# Patient Record
Sex: Female | Born: 1950 | Race: White | Hispanic: Refuse to answer | Marital: Married
Health system: Southern US, Community
[De-identification: ages and names within clinical notes are randomized; demographics above are authoritative.]

## PROBLEM LIST (undated history)

## (undated) DIAGNOSIS — G43909 Migraine, unspecified, not intractable, without status migrainosus: Secondary | ICD-10-CM

## (undated) DIAGNOSIS — I2510 Atherosclerotic heart disease of native coronary artery without angina pectoris: Secondary | ICD-10-CM

## (undated) DIAGNOSIS — E7800 Pure hypercholesterolemia, unspecified: Secondary | ICD-10-CM

## (undated) DIAGNOSIS — S060XAA Concussion with loss of consciousness status unknown, initial encounter: Secondary | ICD-10-CM

## (undated) DIAGNOSIS — S060X9A Concussion with loss of consciousness of unspecified duration, initial encounter: Secondary | ICD-10-CM

## (undated) DIAGNOSIS — M797 Fibromyalgia: Secondary | ICD-10-CM

## (undated) DIAGNOSIS — I10 Essential (primary) hypertension: Secondary | ICD-10-CM

## (undated) HISTORY — DX: Pure hypercholesterolemia, unspecified: E78.00

## (undated) HISTORY — DX: Concussion with loss of consciousness of unspecified duration, initial encounter: S06.0X9A

## (undated) HISTORY — DX: Atherosclerotic heart disease of native coronary artery without angina pectoris: I25.10

## (undated) HISTORY — DX: Concussion with loss of consciousness status unknown, initial encounter: S06.0XAA

## (undated) HISTORY — DX: Fibromyalgia: M79.7

## (undated) HISTORY — DX: Migraine, unspecified, not intractable, without status migrainosus: G43.909

## (undated) HISTORY — PX: CORONARY ANGIOPLASTY WITH STENT PLACEMENT: SHX49

---

## 2001-08-06 ENCOUNTER — Emergency Department: Admit: 2001-08-06 | Payer: Self-pay | Source: Emergency Department | Admitting: Emergency Medicine

## 2002-03-06 ENCOUNTER — Ambulatory Visit: Admit: 2002-03-06 | Disposition: A | Discharge status: Home / Self Care | Payer: Self-pay | Source: Ambulatory Visit

## 2003-05-01 ENCOUNTER — Ambulatory Visit
Admit: 2003-05-01 | Disposition: A | Discharge status: Home / Self Care | Payer: Self-pay | Source: Ambulatory Visit | Admitting: Internal Medicine

## 2004-07-25 ENCOUNTER — Ambulatory Visit: Admission: RE | Admit: 2004-07-25 | Payer: Self-pay | Source: Ambulatory Visit | Admitting: Gastroenterology

## 2004-07-31 ENCOUNTER — Ambulatory Visit
Admit: 2004-07-31 | Disposition: A | Discharge status: Home / Self Care | Payer: Self-pay | Source: Ambulatory Visit | Admitting: Internal Medicine

## 2004-08-21 ENCOUNTER — Ambulatory Visit
Admit: 2004-08-21 | Disposition: A | Discharge status: Home / Self Care | Payer: Self-pay | Source: Ambulatory Visit | Admitting: Orthopaedic Surgery

## 2004-10-29 ENCOUNTER — Ambulatory Visit
Admission: RE | Admit: 2004-10-29 | Disposition: A | Discharge status: Home / Self Care | Payer: Self-pay | Source: Ambulatory Visit | Admitting: Hand Surgery

## 2005-07-06 ENCOUNTER — Ambulatory Visit
Admit: 2005-07-06 | Disposition: A | Discharge status: Home / Self Care | Payer: Self-pay | Source: Ambulatory Visit | Admitting: Internal Medicine

## 2007-01-04 ENCOUNTER — Ambulatory Visit
Admit: 2007-01-04 | Disposition: A | Discharge status: Home / Self Care | Payer: Self-pay | Source: Ambulatory Visit | Admitting: Internal Medicine

## 2007-01-25 ENCOUNTER — Ambulatory Visit
Admission: RE | Admit: 2007-01-25 | Disposition: A | Discharge status: Home / Self Care | Payer: Self-pay | Source: Ambulatory Visit | Admitting: Cardiovascular Disease

## 2007-04-20 ENCOUNTER — Ambulatory Visit
Admit: 2007-04-20 | Disposition: A | Discharge status: Home / Self Care | Payer: Self-pay | Source: Ambulatory Visit | Admitting: Cardiovascular Disease

## 2007-04-24 ENCOUNTER — Emergency Department: Admit: 2007-04-24 | Payer: Self-pay | Source: Emergency Department | Admitting: Emergency Medicine

## 2007-04-24 LAB — CBC AND DIFFERENTIAL
Basophils Absolute: 0 /mm3 (ref 0.0–0.2)
Basophils: 0 % (ref 0–2)
Eosinophils Absolute: 0.1 /mm3 (ref 0.0–0.7)
Eosinophils: 2 % (ref 0–5)
Granulocytes Absolute: 3.6 /mm3 (ref 1.8–8.1)
Hematocrit: 37.7 % (ref 37.0–47.0)
Hgb: 12.5 G/DL (ref 12.0–16.0)
Immature Granulocytes Absolute: 0
Immature Granulocytes: 0 %
Lymphocytes Absolute: 2 /mm3 (ref 0.5–4.4)
Lymphocytes: 32 % (ref 15–41)
MCH: 29.8 PG (ref 28.0–32.0)
MCHC: 33.2 G/DL (ref 32.0–36.0)
MCV: 89.8 FL (ref 80.0–100.0)
MPV: 10.2 FL (ref 7.4–10.4)
Monocytes Absolute: 0.5 /mm3 (ref 0.0–1.2)
Monocytes: 8 % (ref 0–11)
Neutrophils %: 58 % (ref 52–75)
Platelets: 211 /mm3 (ref 140–400)
RBC: 4.2 /mm3 (ref 4.20–5.40)
RDW: 13.5 % (ref 11.5–15.0)
WBC: 6.28 /mm3 (ref 3.50–10.80)

## 2007-04-24 LAB — D-DIMER - SOFT: D-Dimer: 299 ng FEU

## 2007-04-24 LAB — BASIC METABOLIC PANEL
BUN: 9 mg/dL (ref 8–20)
CO2: 22 mEq/L (ref 21–30)
Calcium: 9 mg/dL (ref 8.6–10.2)
Chloride: 111 mEq/L — ABNORMAL HIGH (ref 98–107)
Creatinine: 1 mg/dL (ref 0.6–1.5)
Glucose: 93 mg/dL (ref 70–100)
Potassium: 4 mEq/L (ref 3.6–5.0)
Sodium: 144 mEq/L (ref 136–146)

## 2007-04-24 LAB — I-STAT TROPONIN CERNER: i-STAT Troponin: 0 ng/mL

## 2007-04-24 LAB — B-TYPE NATRIURETIC PEPTIDE: B-Natriuretic Peptide: 11 pg/mL (ref 0–155)

## 2007-04-24 LAB — CK: Creatine Kinase (CK): 40 U/L (ref 20–140)

## 2007-04-24 LAB — GFR

## 2007-05-02 ENCOUNTER — Ambulatory Visit
Admit: 2007-05-02 | Disposition: A | Discharge status: Home / Self Care | Payer: Self-pay | Source: Ambulatory Visit | Admitting: Neurology

## 2007-07-22 ENCOUNTER — Ambulatory Visit
Admit: 2007-07-22 | Disposition: A | Discharge status: Home / Self Care | Payer: Self-pay | Source: Ambulatory Visit | Admitting: Cardiovascular Disease

## 2007-09-29 ENCOUNTER — Ambulatory Visit
Admission: RE | Admit: 2007-09-29 | Disposition: A | Discharge status: Home / Self Care | Payer: Self-pay | Source: Ambulatory Visit | Admitting: Cardiology

## 2008-01-08 ENCOUNTER — Observation Stay
Admission: EM | Admit: 2008-01-08 | Disposition: A | Discharge status: Home / Self Care | Payer: Self-pay | Source: Emergency Department | Admitting: Cardiovascular Disease

## 2008-01-08 LAB — COMPREHENSIVE METABOLIC PANEL
ALT: 31 U/L (ref 3–36)
AST (SGOT): 25 U/L (ref 10–41)
Albumin/Globulin Ratio: 1.3 (ref 1.1–1.8)
Albumin: 3.8 g/dL (ref 3.4–4.9)
Alkaline Phosphatase: 152 U/L — ABNORMAL HIGH (ref 43–112)
BUN: 15 mg/dL (ref 8–20)
Bilirubin, Total: <0.1 mg/dL (ref 0.1–1.0)
CO2: 21 mEq/L (ref 21–30)
Calcium: 9.4 mg/dL (ref 8.6–10.2)
Chloride: 110 mEq/L — ABNORMAL HIGH (ref 98–107)
Creatinine: 1.1 mg/dL (ref 0.6–1.5)
Globulin: 3 g/dL (ref 2.0–3.7)
Glucose: 86 mg/dL (ref 70–100)
Potassium: 4 mEq/L (ref 3.6–5.0)
Protein, Total: 6.8 g/dL (ref 6.0–8.0)
Sodium: 143 mEq/L (ref 136–146)

## 2008-01-08 LAB — CBC AND DIFFERENTIAL
Basophils Absolute: 0 /mm3 (ref 0.0–0.2)
Basophils: 0 % (ref 0–2)
Eosinophils Absolute: 0.3 /mm3 (ref 0.0–0.7)
Eosinophils: 4 % (ref 0–5)
Granulocytes Absolute: 4.2 /mm3 (ref 1.8–8.1)
Hematocrit: 36.6 % — ABNORMAL LOW (ref 37.0–47.0)
Hgb: 12.3 G/DL (ref 12.0–16.0)
Immature Granulocytes Absolute: 0
Immature Granulocytes: 0 %
Lymphocytes Absolute: 2 /mm3 (ref 0.5–4.4)
Lymphocytes: 29 % (ref 15–41)
MCH: 30.4 PG (ref 28.0–32.0)
MCHC: 33.6 G/DL (ref 32.0–36.0)
MCV: 90.4 FL (ref 80.0–100.0)
MPV: 9.7 FL (ref 9.4–12.3)
Monocytes Absolute: 0.5 /mm3 (ref 0.0–1.2)
Monocytes: 7 % (ref 0–11)
Neutrophils %: 60 % (ref 52–75)
Platelets: 202 /mm3 (ref 140–400)
RBC: 4.05 /mm3 — ABNORMAL LOW (ref 4.20–5.40)
RDW: 12.3 % (ref 11.5–15.0)
WBC: 7.07 /mm3 (ref 3.50–10.80)

## 2008-01-08 LAB — GFR

## 2008-01-08 LAB — CK
Creatine Kinase (CK): 35 U/L (ref 20–140)
Creatine Kinase (CK): 32 U/L (ref 20–140)

## 2008-01-08 LAB — I-STAT TROPONIN CERNER: i-STAT Troponin: 0.01 ng/mL

## 2008-01-09 LAB — TROPONIN I QUANTITATIVE LEVEL CERNER
Troponin I: 0.02 ng/mL
Troponin I: 0.01 ng/mL

## 2008-01-09 LAB — CK: Creatine Kinase (CK): 23 U/L (ref 20–140)

## 2008-01-17 ENCOUNTER — Ambulatory Visit
Admit: 2008-01-17 | Disposition: A | Discharge status: Home / Self Care | Payer: Self-pay | Source: Ambulatory Visit | Admitting: Neurology

## 2008-02-12 ENCOUNTER — Emergency Department: Admit: 2008-02-12 | Payer: Self-pay | Source: Emergency Department | Admitting: Emergency Medicine

## 2008-07-02 ENCOUNTER — Emergency Department: Admit: 2008-07-02 | Payer: Self-pay | Source: Emergency Department | Admitting: Emergency Medicine

## 2008-07-02 LAB — PT AND APTT
PT INR: 1.1 {INR} (ref 0.9–1.1)
PT: 12.5 s (ref 10.8–13.3)
PTT: 22 s (ref 21–32)

## 2008-07-02 LAB — COMPREHENSIVE METABOLIC PANEL
ALT: 37 U/L — ABNORMAL HIGH (ref 3–36)
AST (SGOT): 28 U/L (ref 10–41)
Albumin/Globulin Ratio: 1.4 (ref 1.1–1.8)
Albumin: 4.2 g/dL (ref 3.4–4.9)
Alkaline Phosphatase: 120 U/L — ABNORMAL HIGH (ref 43–112)
BUN: 15 mg/dL (ref 8–20)
Bilirubin, Total: <0.1 mg/dL (ref 0.1–1.0)
CO2: 26 mEq/L (ref 21–30)
Calcium: 9.7 mg/dL (ref 8.6–10.2)
Chloride: 108 mEq/L — ABNORMAL HIGH (ref 98–107)
Creatinine: 1.1 mg/dL (ref 0.6–1.5)
Globulin: 3.1 g/dL (ref 2.0–3.7)
Glucose: 81 mg/dL (ref 70–100)
Potassium: 4 mEq/L (ref 3.6–5.0)
Protein, Total: 7.3 g/dL (ref 6.0–8.0)
Sodium: 140 mEq/L (ref 136–146)

## 2008-07-02 LAB — CBC AND DIFFERENTIAL
Basophils Absolute: 0 /mm3 (ref 0.0–0.2)
Basophils: 0 % (ref 0–2)
Eosinophils Absolute: 0.3 /mm3 (ref 0.0–0.7)
Eosinophils: 4 % (ref 0–5)
Granulocytes Absolute: 4 /mm3 (ref 1.8–8.1)
Hematocrit: 36.8 % — ABNORMAL LOW (ref 37.0–47.0)
Hgb: 12.4 G/DL (ref 12.0–16.0)
Immature Granulocytes Absolute: 0
Immature Granulocytes: 1 %
Lymphocytes Absolute: 2.1 /mm3 (ref 0.5–4.4)
Lymphocytes: 30 % (ref 15–41)
MCH: 30.1 PG (ref 28.0–32.0)
MCHC: 33.7 G/DL (ref 32.0–36.0)
MCV: 89.3 FL (ref 80.0–100.0)
MPV: 9.8 FL (ref 9.4–12.3)
Monocytes Absolute: 0.5 /mm3 (ref 0.0–1.2)
Monocytes: 8 % (ref 0–11)
Neutrophils %: 58 % (ref 52–75)
Platelets: 213 /mm3 (ref 140–400)
RBC: 4.12 /mm3 — ABNORMAL LOW (ref 4.20–5.40)
RDW: 13.2 % (ref 11.5–15.0)
WBC: 6.85 /mm3 (ref 3.50–10.80)

## 2008-07-02 LAB — TSH: TSH: 4.198 u[IU]/mL (ref 0.350–4.940)

## 2008-07-02 LAB — URINALYSIS WITH MICROSCOPIC
Bilirubin, UA: NEGATIVE
Blood, UA: NEGATIVE
Glucose, UA: NEGATIVE
Ketones UA: NEGATIVE
Leukocyte Esterase, UA: NEGATIVE
Nitrite, UA: NEGATIVE
Protein, UR: NEGATIVE
RBC, UA: 3 /HPF (ref 0–3)
Specific Gravity UA POCT: 1.014 (ref 1.001–1.035)
Squamous Epithelial Cells, Urine: 1 /HPF
Urine pH: 7 (ref 5.0–8.0)
Urobilinogen, UA: NORMAL mg/dL

## 2008-07-02 LAB — CK: Creatine Kinase (CK): 41 U/L (ref 20–140)

## 2008-07-02 LAB — T4: T4: 5.77 ug/dL (ref 4.87–11.72)

## 2008-07-02 LAB — I-STAT TROPONIN CERNER
i-STAT Troponin: 0 ng/mL
i-STAT Troponin: 0 ng/mL

## 2008-07-02 LAB — GFR

## 2009-01-07 ENCOUNTER — Ambulatory Visit
Admit: 2009-01-07 | Disposition: A | Discharge status: Home / Self Care | Payer: Self-pay | Source: Ambulatory Visit | Admitting: Neurology

## 2009-03-08 ENCOUNTER — Ambulatory Visit
Admit: 2009-03-08 | Disposition: A | Discharge status: Home / Self Care | Payer: Self-pay | Source: Ambulatory Visit | Admitting: Rheumatology

## 2010-02-26 ENCOUNTER — Observation Stay
Admission: EM | Admit: 2010-02-26 | Disposition: A | Discharge status: Home / Self Care | Payer: Self-pay | Source: Emergency Department | Admitting: Internal Medicine

## 2010-02-26 LAB — CBC AND DIFFERENTIAL
Baso(Absolute): 0.02 10*3/uL (ref 0.00–0.20)
Basophils: 0 % (ref 0–2)
Eosinophils Absolute: 0.3 10*3/uL (ref 0.00–0.70)
Eosinophils: 3 % (ref 0–5)
Hematocrit: 37.1 % (ref 37.0–47.0)
Hgb: 12.2 g/dL (ref 12.0–16.0)
Immature Granulocytes Absolute: 0.02 10*3/uL
Immature Granulocytes: 0 % (ref 0–1)
Lymphocytes Absolute: 2.03 10*3/uL (ref 0.50–4.40)
Lymphocytes: 21 % (ref 15–41)
MCH: 29.3 pg (ref 28.0–32.0)
MCHC: 32.9 g/dL (ref 32.0–36.0)
MCV: 89 fL (ref 80.0–100.0)
MPV: 9.8 fL (ref 9.4–12.3)
Monocytes Absolute: 0.59 10*3/uL (ref 0.00–1.20)
Monocytes: 6 % (ref 0–11)
Neutrophils Absolute: 6.61 10*3/uL
Neutrophils: 69 % (ref 52–75)
Platelets: 192 10*3/uL (ref 140–400)
RBC: 4.17 10*6/uL — ABNORMAL LOW (ref 4.20–5.40)
RDW: 13 % (ref 12–15)
WBC: 9.57 10*3/uL (ref 3.50–10.80)

## 2010-02-26 LAB — GFR: EGFR: 57

## 2010-02-26 LAB — BASIC METABOLIC PANEL
BUN: 12 mg/dL (ref 8–20)
CO2: 21 mEq/L (ref 21–30)
Calcium: 8.9 mg/dL (ref 8.6–10.2)
Chloride: 112 mEq/L — ABNORMAL HIGH (ref 98–107)
Creatinine: 1 mg/dL (ref 0.6–1.5)
Glucose: 90 mg/dL (ref 70–100)
Potassium: 4 mEq/L (ref 3.6–5.0)
Sodium: 142 mEq/L (ref 136–146)

## 2010-02-26 LAB — D-DIMER - SOFT: D-Dimer: 358 ng/mL FEU (ref 0–499)

## 2010-02-26 LAB — TROPONIN I: Troponin I: 0 ng/mL (ref 0.00–0.09)

## 2010-02-26 LAB — CK: Creatine Kinase (CK): 32 U/L (ref 20–140)

## 2010-02-26 LAB — I-STAT TROPONIN: i-STAT Troponin: 0 ng/mL (ref 0.00–0.09)

## 2010-02-27 LAB — CBC
Hematocrit: 34.5 % — ABNORMAL LOW (ref 37.0–47.0)
Hgb: 11.2 g/dL — ABNORMAL LOW (ref 12.0–16.0)
MCH: 28.9 pg (ref 28.0–32.0)
MCHC: 32.5 g/dL (ref 32.0–36.0)
MCV: 89.1 fL (ref 80.0–100.0)
MPV: 10.1 fL (ref 9.4–12.3)
Platelets: 184 10*3/uL (ref 140–400)
RBC: 3.87 10*6/uL — ABNORMAL LOW (ref 4.20–5.40)
RDW: 14 % (ref 12–15)
WBC: 7.69 10*3/uL (ref 3.50–10.80)

## 2010-02-27 LAB — GFR: EGFR: 57

## 2010-02-27 LAB — LIPID PANEL
Cholesterol / HDL Ratio: 3.1 Index
Cholesterol: 149 mg/dL (ref 0–199)
HDL: 48 mg/dL (ref >40)
LDL Calculated: 81 mg/dL (ref 0–99)
Triglycerides: 102 mg/dL (ref 34–149)
VLDL Calculated: 20 mg/dL (ref 10–40)

## 2010-02-27 LAB — COMPREHENSIVE METABOLIC PANEL
ALT: 62 U/L — ABNORMAL HIGH (ref 3–36)
AST (SGOT): 41 U/L (ref 10–41)
Albumin/Globulin Ratio: 1.3 (ref 1.1–1.8)
Albumin: 3.5 g/dL (ref 3.4–4.9)
Alkaline Phosphatase: 133 U/L — ABNORMAL HIGH (ref 43–112)
BUN: 11 mg/dL (ref 8–20)
Bilirubin, Total: 0.6 mg/dL (ref 0.1–1.0)
CO2: 21 mEq/L (ref 21–30)
Calcium: 8.7 mg/dL (ref 8.6–10.2)
Chloride: 110 mEq/L — ABNORMAL HIGH (ref 98–107)
Creatinine: 1 mg/dL (ref 0.6–1.5)
Globulin: 2.7 g/dL (ref 2.0–3.7)
Glucose: 89 mg/dL (ref 70–100)
Potassium: 3.7 mEq/L (ref 3.6–5.0)
Protein, Total: 6.2 g/dL (ref 6.0–8.0)
Sodium: 140 mEq/L (ref 136–146)

## 2010-02-27 LAB — HEMOLYSIS INDEX: Hemolysis Index: 14 Index — ABNORMAL HIGH (ref 0–9)

## 2010-02-27 LAB — TROPONIN I: Troponin I: 0 ng/mL (ref 0.00–0.09)

## 2010-02-27 LAB — CK: Creatine Kinase (CK): 39 U/L (ref 20–140)

## 2011-02-18 LAB — ECG 12-LEAD
Atrial Rate: 66 {beats}/min
Atrial Rate: 66 {beats}/min
Atrial Rate: 82 {beats}/min
P Axis: 20 degrees
P Axis: 24 degrees
P Axis: 6 degrees
P-R Interval: 150 ms
P-R Interval: 170 ms
P-R Interval: 176 ms
Q-T Interval: 346 ms
Q-T Interval: 380 ms
Q-T Interval: 410 ms
QRS Duration: 76 ms
QRS Duration: 82 ms
QRS Duration: 86 ms
QTC Calculation (Bezet): 398 ms
QTC Calculation (Bezet): 404 ms
QTC Calculation (Bezet): 429 ms
R Axis: -3 degrees
R Axis: -3 degrees
R Axis: -8 degrees
T Axis: 18 degrees
T Axis: 32 degrees
T Axis: 8 degrees
Ventricular Rate: 66 {beats}/min
Ventricular Rate: 66 {beats}/min
Ventricular Rate: 82 {beats}/min

## 2011-02-20 LAB — ECG 12-LEAD
Atrial Rate: 76 {beats}/min
P Axis: 13 degrees
P-R Interval: 142 ms
Q-T Interval: 366 ms
QRS Duration: 76 ms
QTC Calculation (Bezet): 411 ms
R Axis: 2 degrees
T Axis: 19 degrees
Ventricular Rate: 76 {beats}/min

## 2011-03-03 LAB — ECG 12-LEAD
Atrial Rate: 52 {beats}/min
Atrial Rate: 60 {beats}/min
P Axis: -1 degrees
P Axis: -5 degrees
P-R Interval: 144 ms
P-R Interval: 148 ms
Q-T Interval: 414 ms
Q-T Interval: 454 ms
QRS Duration: 80 ms
QRS Duration: 86 ms
QTC Calculation (Bezet): 414 ms
QTC Calculation (Bezet): 422 ms
R Axis: -3 degrees
R Axis: -4 degrees
T Axis: 17 degrees
T Axis: 25 degrees
Ventricular Rate: 52 {beats}/min
Ventricular Rate: 60 {beats}/min

## 2011-03-10 LAB — ECG 12-LEAD
Atrial Rate: 52 {beats}/min
Atrial Rate: 62 {beats}/min
P Axis: 12 degrees
P Axis: 4 degrees
P-R Interval: 138 ms
P-R Interval: 168 ms
Q-T Interval: 414 ms
Q-T Interval: 448 ms
QRS Duration: 74 ms
QRS Duration: 82 ms
QTC Calculation (Bezet): 416 ms
QTC Calculation (Bezet): 420 ms
R Axis: 15 degrees
R Axis: 7 degrees
T Axis: 48 degrees
T Axis: 49 degrees
Ventricular Rate: 52 {beats}/min
Ventricular Rate: 62 {beats}/min

## 2011-03-12 LAB — ECG 12-LEAD
Atrial Rate: 62 {beats}/min
P Axis: 20 degrees
P-R Interval: 156 ms
Q-T Interval: 406 ms
QRS Duration: 82 ms
QTC Calculation (Bezet): 412 ms
R Axis: 12 degrees
T Axis: 41 degrees
Ventricular Rate: 62 {beats}/min

## 2011-03-20 NOTE — H&P (Signed)
[name], [name]      MRN:          [id number]      Account:      [id number]      Document ID:  [id number]                  Admit Date: 02/26/2010            Patient Location: [facility]      Patient Type: I            ATTENDING PHYSICIAN: Herbert Moors, MD                  CHIEF COMPLAINT:      Chest pain, patient admitted to rule out MI.            HISTORY OF PRESENT ILLNESS:      This is a 60 year old female with multiple medical history significant for      hypertension, elevated cholesterol, fibromyalgia, depression, coronary      artery disease status post stent, history of right shoulder surgery, and      obesity, who presented to the emergency room with the complaints of chest      pain.  As per history, the patient was apparently doing okay.  She got up      with chest pain, severe in nature, could not tolerate.  Pain was mainly      over the anterior part of the chest, left precordial area, left      retrosternal area, and radiating towards the shoulder.  She also complains      of pain over the left upper arm and also left hand pricking sensation.  She      denied any shortness of breath but states that she had shallow breathing to      avoid pain.  She denied any fever.  No cough, no headache in the beginning      but started after using nitroglycerin.  She said her left eye is always      like that.  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  No diaphoresis, no swelling      legs, no joint swelling, no abdominal pain, no altered bowel or urinary      habits.            PAST MEDICAL HISTORY:      Hypertension, elevated cholesterol, fibromyalgia, depression, obesity,      right shoulder surgery, coronary artery disease status post stent,      glaucoma, left eye.  Negative for diabetes mellitus.            PERSONAL HISTORY:      Negative for smoking, negative for drugs, and negative for alcohol.            FAMILY HISTORY:      Noncontributory.            MEDICATIONS:      Include Lovaza, Allegra, vitamin B12,  Colace, Caltrate, Zetia, magnesium,      Zoloft, Lipitor, aspirin.            ALLERGIES:      No known drug allergies.                                   Page 1 of 3      DEL, WISEMAN      MRN:  [id number]      Account:      [id number]      Document ID:  [id number]                        PHYSICAL EXAMINATION:      GENERAL:  The patient is conscious, alert, oriented.      VITAL SIGNS:  Temperature 99.4, pulse 88 per minute, blood pressure 122/66      mmHg, respiratory rate 16 per minute, and pulse oximetry 93%.      HEAD AND NECK:  Drooping of upper lid left eye present.      CARDIOVASCULAR:  S1, S2 heard with normal intensity in all 4 areas.  No      murmur.      RESPIRATORY:  Lungs clear per auscultation.      ABDOMEN:  Soft.      EXTREMITIES:  No edema.      CENTRAL NERVOUS SYSTEM:  No changes.            LABORATORY DATA:      WBC 9.5, hemoglobin 12, hematocrit 37, MCV 89, platelets 192,000.  Sodium      142, potassium 4, chloride 112, carbon dioxide 21, BUN 12, creatinine 1,      glucose 90, calcium 8.9.  CK 32.  Troponin 0.00 x2.  D-dimer 358.            RADIOLOGIC DATA:      Chest x-ray shows left mild basilar atelectasis.            DIAGNOSTIC DATA:      EKG:  Normal sinus rhythm with T-wave changes in the anterior leads.            IMPRESSION:      1.  Chest pain, rule out myocardial infarction.      2.  Abnormal electrocardiogram, rule out myocardial infarction.      3.  Hypertension.      4.  Elevated cholesterol.      5.  History of coronary artery disease status post stent.      6.  Fibromyalgia.      7.  Depression.      8.  Obesity.      9.  Glaucoma, left eye.            PLAN:      Admit the patient to telemetry and monitor vital signs.  Rule out      myocardial infarction with enzymes.  Get a cardiology consult.  Continue      with her medication.  Lipid profile in the morning, Echocardiogram.  The      patient may go for a stress test.  Activity as tolerated.  Discharge to      home when  stable.                        Electronic Signing Provider                                   Page 2 of 3      SHERAH, LUND      MRN:          [id number]      Account:      [id number]      Document ID:  [id number]  D:  02/26/2010 21:22 PM by Dr. Herbert Moors, MD [phone]      T:  02/26/2010 21:56 PM by [id number]                  cc:                                   Page 3 of 3      Authenticated by Herbert Moors ([id number]) On 03/04/2010 07:58:07 PM

## 2011-03-20 NOTE — Consults (Signed)
[name], [name]      MRN:          [id number]      Account:      [id number]      Document ID:  [id number]      Service Date: 02/26/2010            Admit Date: 02/26/2010            Patient Location: [facility]01      Patient Type: I            CONSULTING PHYSICIAN: Sharaine Delange Larina Earthly MD            REFERRING PHYSICIAN: Karma Ganja MD            REASON FOR CONSULTATION:      Evaluation of chest pain.            HISTORY OF PRESENT ILLNESS:      The patient is a 60 year old female with a known history of coronary      disease.  She is status post PCI of the D2 branch because of chest pain in      2009.  She presents here because of chest pain that occurred this morning.      She states she woke up and had chest pain that was with cough, inspiration,      and sitting up.  She states that her activities have been slightly      diminished because of this.  She has some mild shortness of breath because      of it as well.  She states she has had bronchitis about 4 or 5 months ago      and has had on and off sinus infection since.  She denies any      lightheadedness, dizziness, and no syncope, but she is weak overall today      compared to before.  Her chest pain is retrosternal, nonradiating, and 5/10      in severity, and there are no provocative or palliative features.            PAST MEDICAL HISTORY:      1.  CAD as noted above with history of LAD PCI and angioplasty of D2 in      2009 and history of PCI of the LAD and D1 in 2008.      2.  Fibromyalgia.      3.  Migraines.      4.  Hyperlipidemia.            ALLERGIES:      PLAVIX.            MEDICATIONS:      At home, aspirin, metoprolol, pravastatin, Topamax, Zetia.            SOCIAL HISTORY:      Noncontributory.            FAMILY HISTORY:      Noncontributory.                                         Page 1 of 3      [name], [name]      MRN:          [id number]      Account:      [id number]      Document ID:  [id number]  Service Date: 02/26/2010             PHYSICAL EXAMINATION:      VITAL SIGNS:  Blood pressure is 126/80.  Pulse is 70.      NECK:  Supple, nontender.      CARDIOVASCULAR:  Rate and rhythm regular.  Normal S1, normal S2, no S3.      LUNGS:  Clear to auscultation bilaterally.      ABDOMEN:  Soft, nontender.      EXTREMITIES:  2+ pedal pulses.  No edema.            DIAGNOSTIC DATA:      EKG from the emergency room shows sinus rhythm, nonspecific ST-T wave      changes throughout the precordium, no evidence of pericarditis on this ECG.       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  I do      not have any old EKGs to compare to.            LABORATORY DATA:      WBC 9, hemoglobin 12, hematocrit 37, platelet count 182.  Sodium 142,      potassium 4.0, BUN is 12, creatinine 1.0.  Troponin 0.00.  D-dimer is 358.            IMPRESSION:      1.  Atypical chest pain, coronary artery disease.      2.  Possible pleuritis or pericarditis as a potential cause.      3.  History of fibromyalgia.      4.  History of Plavix allergy.      5.  History of coronary artery disease.      6.  History of percutaneous coronary intervention to the left anterior      descending and angioplasty to the D1.            RECOMMENDATIONS:      The patient has atypical chest pain, coronary artery disease.  She will be      fully ruled out here.  She will restart all of her medicines from home.      She does not have the list with her, but her husband is bringing in the      list from home as well.  We will check an echocardiogram to assess for      myocarditis or signs for any potential wall motion abnormalities or      pericardial effusion as a potential contributing cause of her symptoms, but      I think it is highly unlikely and seems like her symptoms are not related      to cardiovascular causes as of now.  If she is ruled out, she can have an      outpatient stress test and follow up with Dr. Pecolia Ades.            Thank you for allowing me to participate  with the patient.  We will follow      with you.                        Electronic Signing Provider                                         Page 2 of 3  [name], [name]      MRN:          [id number]      Account:      [id number]      Document ID:  [id number]      Service Date: 02/26/2010            D:  02/26/2010 15:58 PM by Dr. Laurence Compton. [place] ([id number])      T:  02/26/2010 17:30 PM by [id number]                  cc:                                   Page 3 of 3      Authenticated and Edited by Baldwin Crown, MD On 02/28/10 12:53:30 PM

## 2011-03-20 NOTE — Op Note (Signed)
DATE OF BIRTH:                        [birth year]      ADMISSION DATE:                     09/29/2007            PATIENT LOCATION:                     [facility]            DATE OF PROCEDURE:                   09/29/2007      SURGEON:                            Nelida Gores, MD      ASSISTANT(S):                  PREOPERATIVE DIAGNOSIS:  ATHEROSCLEROTIC HEART DISEASE, CHEST PAIN, STATUS      POST STENTING OF THE LEFT ANTERIOR DESCENDING CORONARY ARTERY.            POSTOPERATIVE DIAGNOSIS:  ATHEROSCLEROTIC HEART DISEASE WITH WIDELY PATENT      STENTED SEGMENT OF THE LEFT ANTERIOR DESCENDING CORONARY ARTERY AND      ANGIOPLASTIED SEGMENT OF THE SECOND DIAGONAL BRANCH.            PROCEDURE:  LEFT HEART CATHETERIZATION, SELECTIVE CORONARY ANGIOGRAM, LEFT      VENTRICULOGRAM AND INTRAVASCULAR ULTRASOUND OF THE LEFT ANTERIOR DESCENDING      CORONARY ARTERY.            PATIENT DATA:  Age;  60.  Sex;  female.  Height;  178 cm.  Weight;  109 kg.      Body surface area;  2.3 m2.  Rhythm;  sinus.            DESCRIPTION OF PROCEDURE:  The patient was medicated with Versed 1 mg IV      and fentanyl a total of 75 mcg IV.  Using modified Seldinger technique      employing a sheath, a 4.5 left Judkins catheter was inserted in the right      femoral sheath and advanced to the left coronary ostium.  Selective left      coronary angiogram was obtained in multiple projections.  The catheter was      exchanged for a 4-French 3DRC catheter, which was used to obtain selective      right coronary angiograms.  The catheter was exchanged for a 4-French      pigtail catheter, which was positioned in the left ventricle and pressure      recorded.  In a 35 degree RAO projection, left ventriculogram was obtained,      injecting Visipaque at 12-mL/sec for a total of 34-mL. Pressure measurement      following left ventriculogram and the catheter pulled back to the aortic      root.            Because of evidence of what was found  angiographically to be      hemodynamically insignificant narrowing in the left anterior descending      coronary artery immediately proximal to the stent, it was elected to      proceed with IVUS of the left anterior  descending coronary artery in an      effort to definitively exclude the presence of significant disease as the      cause of the patient's symptoms.  The 4-French sheath was exchanged for a      6-French sheath.  The patient was given 4000 units of heparin      intravenously.  Initially a 4.5 6-French left guiding catheter was advanced      to the left coronary ostium.  The engagement was not optimal and was      exchanged for a 4 left 6-French guiding catheter.  A 0.04 inch high-torque      floppy guide wire was advanced across the stented segment of the left      anterior descending coronary artery.  A 40 mhz Atlantis Pro IVUS catheter      was advanced into the left anterior descending coronary artery and      positioned distal to the stented segment.  An intravascular ultrasound      examination was performed of the stented portion of the left anterior      descending coronary artery and more proximal area of the left anterior      descending coronary artery.  The stented portion of the left anterior      descending coronary artery was widely patent with good apposition of the      stent struts.  Immediately proximal to the stent, there was an area of      minor narrowing appreciated.  The narrowing represented approximately a 20%      area stenosis with a residual lumen of 5.6 mm2.  This was thought to      represent hemodynamically insignificant plaquing.  The IVUS catheter and      guide catheter were removed.  Injection into the sheath revealed the entry      point in the femoral artery above the bifurcation.  The sheath was removed      and hemostasis achieved utilizing an Angio-Seal device.            The patient tolerated the procedure well, returned to the [facility] pain-free,       hemodynamically stable.            Pressure measurements:  Central aortic pressure;  systolic 110, diastolic      58, mean 81.  Ventricle;  systolic 110, pre A 2, Z 16.            Selective coronary angiograms;  There is evidence of a stent in the mid      portion of left anterior descending coronary artery.  The left main      coronary artery is a short, large caliber vessel which is widely patent.            The left anterior descending coronary artery is a moderately large caliber      vessel which gives off an early small diagonal branch.  Distal to the      origin of the diagonal, there is what appears to be less than 30% narrowing      in the left anterior descending coronary artery immediately proximal to the      proximal border of the stented portion of the left anterior descending      coronary artery.  This stented portion appears to be widely patent.  A      second small to moderate sized diagonal branch arises within the stented  portion and is widely patent.  The left anterior descending coronary artery      continues as a moderate caliber vessel which gives off three small distal      diagonal branches.  The left anterior descending coronary tapers to a small      caliber vessel as it rounds the apex of the left ventricle.             The      circumflex      system is large.  The circumflex gives of a small first marginal branch and      large obtuse marginal branch.  The circumflex continues to give off a small      distal marginal.  No significant disease is appreciated in the circumflex      system.                                                                 The      circulation is that of a dominant right coronary artery.  The      right coronary artery is a large caliber vessel, giving off large posterior      descending branch and posterolateral branch.  No significant disease is      appreciated in the right coronary system.            Left ventriculogram;  Left ventriculogram in 35  degree RAO projection      demonstrates the left ventricle with cavity size appears normal.  Overall      left ventricular contractility is vigorous.  No regional areas of abnormal      wall motion are appreciated.  Calculated ejection fraction is 80%.  The      free wall of the left ventricle appears to be normal in thickness.  No      significant mitral regurgitation is appreciated.            DISCUSSION:  This 60 year old lady has a long history of complaints of      chest discomfort, some of which have been attributed to fibromyalgia.  A      year ago the patient underwent a nuclear stress test which was abnormal.      Cardiac catheterization was performed and revealed a high grade stenosis in      the left anterior descending coronary artery involving the origin of the      second diagonal branch.  The left anterior descending coronary artery was      stented with a Cypher drug-eluting stent and the diagonal angioplastied      with an excellent angiographic result.  A few months ago, the patient had a      repeat nuclear study which was negative for ischemia.  However, the patient      has become progressively symptomatic with complaints of nonexertional chest      pain as well as profound fatigue with exercise.  Because of progressive      complaints in a lady that is very difficult to evaluate because of      fibromyalgia, repeat catheterization was requested.  The study today shows      minor plaquing in the left anterior descending coronary artery proximal to  the stented segment with a stented segment of the left anterior descending      coronary artery and the ostium of the angioplastied diagonal being widely      patent.  No significant disease is appreciated in the coronary system.  The      left ventricular contractility demonstrates normal left ventricular      contractility.  Given the findings of angiography and IVUS, it was thought      the patient's symptoms are likely noncardiac.             DIAGNOSES      1.   Atherosclerotic disease.           a)  Status post stenting of the left anterior descending coronary      artery and      angioplasty of second diagonal branch.           b)  Widely patent vessels.           c)  Normal left ventricular contractility.      2.   History of fibromyalgia.                                          Electronic Signing MD: Nelida Gores, MD  ([id number])            D: 09/29/2007 by Nelida Gores, MD      T: 09/29/2007 by [id number] ([id number]) ([id number])      cc:  Marvene Staff, MD          Nelida Gores, MD          Rosealee Albee, MD

## 2011-05-19 NOTE — Op Note (Signed)
Cath Lab Report -- Comprehensive Report      Patient: [name]      MR Number: [id number]      Study Date: 01/25/2007      Acct Number: [id number]      DOB: 06/24/1950      Age: 60 years      Gender: Female      Height: 70.1 in  ( 178 cm)      Weight: 239.6 lb  ( 108.9 kg)      BSA: 2.26 m      Study ID: [id number]            Interventional Cardiologist:  Burton Apley. Raybuck, MD            Summary:            - Coronary circulation: Mid LAD: There was a tubular 95 % stenosis. There was      TIMI grade 3 flow through the vessel (brisk flow). This lesion is a likely       culprit for the patient's abnormal stress test. An intervention was performed.            - Cardiac structures: EF calculated by contrast ventriculography was 65 %.            - 1st lesion interventions: A successful stent with balloon angioplasty was      performed on the 95 % lesion in the mid LAD. Following intervention there was       an excellent angiographic appearance with a 0 % residual stenosis.            - 2nd lesion interventions: A successful balloon angioplasty was performed on      the 90 % lesion in the 1st diagonal. Following intervention there was an       excellent angiographic appearance with a 10 % residual stenosis.            Procedures performed      -  Left coronary angiography.      -  Right coronary angiography.      -  Left heart catheterization with ventriculography.      - DES - 1st vess. [phone]).      - Intervention on mid LAD: stent, balloon angioplasty.      - Intervention on D1: balloon angioplasty.            Impressions: 60 yo female with HLP, new onset exertional chest pain and abnormal      thallium c/w anterior ischemia. Current study reveals "culprit" lesion mid-LAD      involving D1 bifurcation treated with DES LAD and PTCA diagonal origin. Normal      CX, RCA and LV function.      Recommendations  - Add Plavix 75mg /day minimum of 1 year.            Ventricles: There were no left ventricular global or  regional wall motion      abnormalities. EF calculated by contrast ventriculography was 65 %.      Coronary vessels: The coronary circulation is right dominant. Left main: Normal.      Mid LAD: There was a tubular 95 % stenosis. There was TIMI grade 3 flow through      the vessel (brisk flow). This lesion is a likely culprit for the patient's      abnormal stress test. An intervention was performed. 1st diagonal: There was a  discrete 90 %origin stenosis. PTCA performed. Circumflex: Normal. Large OM1      present. RCA: The vessel was large sized (dominant). Angiography showed no      evidence of disease.      Right lower extremity vessels: Right common femoral: Normal.            Procedure: The risks and alternatives of the procedures and conscious sedation      were explained to the patient and informed consent was obtained. The patient      was kept NPO, brought to the cath lab, and placed on the table. The planned      puncture sites were prepped and draped in the usual sterile fashion. Right      femoral artery access. The puncture site was infiltrated with 1 % lidocaine.      The vessel was accessed using the modified Seldinger technique, a wire was      threaded into the vessel, and a 670F sheath was advanced over the wire into the      vessel. Cardiac catheterization performed electively. Coronary intervention      performed electively.      Left coronary artery angiography. A 670F JL4 catheter was advanced to the aorta      and positioned in the vessel ostium under fluoroscopic guidance. Angiography      was performed in multiple projections using power-assisted injection of      contrast.      Right coronary artery angiography. A 670F 3DRC catheter was advanced to the aorta      and positioned in the vessel ostium under fluoroscopic guidance. Angiography      was performed in multiple projections using power-assisted injection of      contrast.      Left heart catheterization with ventriculography. A 670F  angled pigtail catheter      was advanced to the ascending aorta. After recording ascending aortic pressure,      the catheter was advanced across the aortic valve and left ventricular pressure      was recorded. Ventriculography was performed using power injection of contrast      agent. Imaging was performed using an RAO projection. Post-ventriculography LV      pressure was obtained. The catheter was gradually withdrawn into the aorta      under continuous pressure monitoring and aortic pressure was recorded.      Lesion #1 intervention: A successful stent with balloon angioplasty was      performed on the 95 % lesion in the mid LAD. Following intervention there was      an excellent angiographic appearance with a 0 % residual stenosis. There was no      dissection. 70F short 11cm sheath used. Angiomax used. Arteriotomy closure with      Perclose Proglide.      Vessel setup was performed. A 6 Fr JL4 guiding catheter was used to intubate the      vessel. A ASAHI PROWATER 300CM wire was used to cross the lesion. [employer] used to protect sidebranch D1.      Predilated, using a VOYAGER-OTW 2.5X12MM balloon, with 2 inflations and a      maximum inflation pressure of 12 atm.      A CYPHER RX 3.00X18MM DES sirolimus-eluting stent with one inflation at a       maximum inflation pressure of 16 atm.      Lesion #2 intervention:  A successful balloon angioplasty was performed on the 90      % lesion in the 1st diagonal. Following intervention there was an excellent      angiographic appearance with a 10 % residual stenosis.      Vessel setup was performed. A Asahi Prowater wire was used to cross the lesion      through stent struts.      Dilated, using a VOYAGER-OTW 2.0X12MM balloon, with 1 inflations and a maximum      inflation pressure of 12 atm.      Cardiac interventions      DES - 1st vess. [phone]).      Procedure completion: An IABP was not used. Timing: Test started at 14:24. Test      concluded at  15:29. Hemostasis: Hemostasis was obtained. The sheaths were      removed. The access site was sutured using the Perclose suture system.      Medications given: Midazolam, 2 mg, IV, at 14:26. Fentanyl, 50 mcg, IV, at      14:27. Midazolam, 2 mg, IV, at 14:31. Fentanyl, 50 mcg, IV, at 14:31.      Midazolam, 1 mg, IV, at 15:02. Fentanyl, 25 mcg, IV, at 15:02. Nitroglycerin,      200 mcg, intracoronary, at 14:53. Nitroglycerin, 200 mcg, intracoronary, at      15:02. Bivalirudin (Angiomax), 81.8 mg, IV, last dose at 14:40. Bivalirudin      (Angiomax), 38, IV, last dose at 15:03, ml/hr. Clopidogrel (Plavix), 300 mg,      PO, last dose at 15:16. Normal Saline, 100, IV, at 11:52, ml/hr. Contrast      given: 260 ml Optiray. Radiation exposure: Fluoroscopy time: 9.3 min.            Prepared and signed by            Burton Apley. Raybuck, MD      Signed 01/25/2007 19:07:23            Hemodynamic Tables            Pressures:  NO PHASE      Pressures:  - HR: 50      Pressures:  - Rhythm:      Pressures:  -- Aortic Pressure (S/D/M): 115/67/89      Pressures:  -- Left Ventricle (s/edp): 120/30/--            Outputs:  NO PHASE      Outputs:  -- CALCULATIONS[age]      Outputs:  -- CALCULATIONS: Body Surface Area: 2.26      Outputs:  -- CALCULATIONS: Height in cm: 178.00      Outputs:  -- CALCULATIONS: Sex: Female      Outputs:  -- CALCULATIONS: Weight in kg: 108.90            Study images            *** NOTE: Images utilize JPEG compression.            (N: [id number])

## 2011-05-19 NOTE — Procedures (Signed)
[facility]      ,            Transthoracic Echocardiogram      2D, M-mode, Doppler, and Color Doppler      Study date:  27-Feb-2010            Patient: [name]      MR #: [id number]      Account #: [id number]      DOB: 07-May-1951      Age: 60 years      Gender: Female      Height: 70 in      Weight: 244.4 lb      BSA: 2.28 m            Allergies: PENICILLINS, SULFONAMIDES, Codeine Phosphate, tomatoes, onions,      oats, mushroom, wheat, CORTISPORIN, SENSITIVE TO ALL ANTIBIOTICS            Sonographer:  Williemae Natter, RDCS      Cardiologist:  Veverly Fells. Vives, MD            CLINICAL QUESTION: CP, eval LVF            HISTORY: PRIOR HISTORY: HTN, HLD, fibromyalgia, stent to L main            PROCEDURE: The procedure was performed in the echo lab. This was a routine      study. The transthoracic approach was used. The study included complete 2D      imaging, M-mode, complete spectral Doppler, and color Doppler. Image quality      was adequate.            SYSTEM MEASUREMENT TABLES            2D      %FS: 39.1 %      Ao Diam: 3.3 cm      EF(Teich): 69.2 %      ESV(Teich): 41.2 ml      IVSd: 1 cm      LA Diam: 3.5 cm      LVIDd: 5.3 cm      LVIDs: 3.2 cm      LVPWd: 0.9 cm      SV(Teich): 92.5 ml            CW      AR Dec Slope: 1.8 m/s2      AR Dec Time: 2100.8 ms      AR PHT: 609.2 ms      AV Env.Ti: 288.4 ms      AV VTI: 31.6 cm      AV Vmax: 152.4 cm/s      AV Vmean: 109.5 cm/s      AV maxPG: 9.3 mmHg      AV meanPG: 5.3 mmHg      PV Vmax: 108.4 cm/s      PV maxPG: 4.7 mmHg      TR Vmax: 249.9 cm/s      TR maxPG: 25 mmHg            MM      AV Cusp: 2.4 cm            PW      AVA (VTI): 3 cm2      AVA Vmax: 3 cm2      HR: 189.8 BPM      LVOT Env.Ti: 316.1 ms      LVOT VTI: 22.5 cm      LVOT Vmax: 107.2 cm/s  LVOT Vmean: 71.3 cm/s      LVOT maxPG: 4.6 mmHg      LVOT meanPG: 2.4 mmHg      MV A Vel: 91.7 cm/s      MV DecT: 272.1 ms      MV E Vel: 68.8 cm/s      MV E/A Ratio: 0.8      MV PHT: 78.9 ms      MV dec  slope: 2.5 m/s2      MVA By PHT: 2.8 cm2            LEFT VENTRICLE: Size was normal. Systolic function was normal. Ejection      fraction was estimated in the range of 60 % to 65 %. There were no regional      wall motion abnormalities. Wall thickness was normal. Doppler: There was an      increased relative contribution of atrial contraction to ventricular filling.      Doppler parameters were consistent with abnormal left ventricular relaxation      (grade 1 diastolic dysfunction).            AORTIC VALVE: The valve was trileaflet. Leaflets exhibited mildly increased      thickness and normal cuspal separation. Doppler: Transaortic velocity was      within the normal range. There was no stenosis. There was mild regurgitation.            AORTA: The root exhibited normal size.            MITRAL VALVE: Valve structure was normal. There was normal leaflet separation.      Doppler: The transmitral velocity was within the normal range. There was no      evidence for stenosis. There was trivial regurgitation.            LEFT ATRIUM: Size was normal.            RIGHT VENTRICLE: The size was normal. Systolic function was normal. Wall      thickness was normal. Doppler: Systolic pressure was at the upper limits of      normal. Estimated peak pressure was 30 mmHg.            PULMONIC VALVE: Doppler: The transpulmonic velocity was within the normal      range. There was no regurgitation.            PULMONARY ARTERY: The size was normal.            TRICUSPID VALVE: The valve structure was normal. There was normal leaflet      separation. Doppler: The transtricuspid velocity was within the normal range.      There was no evidence for tricuspid stenosis. There was no regurgitation.            RIGHT ATRIUM: Size was normal.            SYSTEMIC VEINS: IVC: The inferior vena cava was normal in size.            PERICARDIUM: There was no pericardial effusion.            SUMMARY:            -  Left ventricle:      -  Size was normal.       -  Systolic function was normal. Ejection fraction was estimated in the range      of 60 % to 65 %.      -  There were no regional wall motion abnormalities.      -  Wall thickness was normal.      -  There was an increased relative contribution of atrial contraction to      ventricular filling. -  Doppler parameters were consistent with abnormal left      ventricular relaxation (grade 1 diastolic dysfunction).            -  Aortic valve:      -  There was mild regurgitation.            -  Mitral valve:      -  There was trivial regurgitation.            -  Left atrium:      -  Size was normal.            -  Right ventricle:      -  Systolic pressure was at the upper limits of normal. Estimated peak pressure      was 30 mmHg.            -  Pericardium:      -  There was no pericardial effusion.            Prepared and signed by            Veverly Fells. Levy Sjogren, MD      Signed 27-Feb-2010 14:51:01                  (N: [id number])

## 2012-02-18 ENCOUNTER — Ambulatory Visit: Payer: BLUE CROSS/BLUE SHIELD | Attending: Rheumatology

## 2012-02-18 ENCOUNTER — Other Ambulatory Visit: Payer: Self-pay | Admitting: Rheumatology

## 2012-02-18 DIAGNOSIS — M25549 Pain in joints of unspecified hand: Secondary | ICD-10-CM

## 2012-02-18 DIAGNOSIS — IMO0002 Reserved for concepts with insufficient information to code with codable children: Secondary | ICD-10-CM

## 2012-02-18 DIAGNOSIS — M5030 Other cervical disc degeneration, unspecified cervical region: Secondary | ICD-10-CM | POA: Insufficient documentation

## 2012-02-18 DIAGNOSIS — M47817 Spondylosis without myelopathy or radiculopathy, lumbosacral region: Secondary | ICD-10-CM | POA: Insufficient documentation

## 2012-02-18 DIAGNOSIS — M5126 Other intervertebral disc displacement, lumbar region: Secondary | ICD-10-CM | POA: Insufficient documentation

## 2012-02-18 DIAGNOSIS — M5020 Other cervical disc displacement, unspecified cervical region: Secondary | ICD-10-CM | POA: Insufficient documentation

## 2012-02-18 DIAGNOSIS — M47812 Spondylosis without myelopathy or radiculopathy, cervical region: Secondary | ICD-10-CM | POA: Insufficient documentation

## 2012-06-03 NOTE — Op Note (Unsigned)
DATE OF BIRTH:                        1950-09-13      ADMISSION DATE:                     07/25/2004            PATIENT LOCATION:                     END END 20            DATE OF PROCEDURE:                   07/25/2004      SURGEON:                            Wyvonnia Lora, MD      ASSISTANT(S):                  PREOPERATIVE DIAGNOSIS:  DYSPEPSIA AND GASTROESOPHAGEAL REFLUX DISEASE.            POSTOPERATIVE DIAGNOSIS:  SMALL HIATAL HERNIA.  OTHERWISE NORMAL      EXAMINATION.            PROCEDURE:  ESOPHAGOGASTRODUODENOSCOPY.            DESCRIPTION OF PROCEDURE:    The patient presented to the [facility] lab after an      overnight fast.  IV line was placed and she was given 5 mg of IV Versed and      25 mcg of IV fentanyl as well as local anesthetic spray to the throat.  The      upper endoscope was passed without difficulty.  The esophagus appeared to      be completely normal without any esophagitis or other changes.  There was a      small hiatal hernia present.  Retroflexed view of the cardia showed no      abnormalities.  The fundus, body, and antrum of the stomach were all      normal.  A random sample of the antrum was taken for urease testing.  The      duodenal bulb and descending duodenum were normal in appearance.  The scope      was removed.  The patient tolerated the procedure very well.                                                ___________________________________          Date Signed: __________      Wyvonnia Lora, MD  ([id number])            D: 07/25/2004 by Wyvonnia Lora, MD      T: 07/25/2004 by [id number] ([id number]) ([id number])      cc:  Wyvonnia Lora, MD

## 2012-06-03 NOTE — Op Note (Unsigned)
PATIENTSHEKELIA, [name]      MEDICAL RECORD NUMBER:         [id number]      PATIENT LOCATION/SERVICE:       [id number]            DATE OF SURGERY:               10/29/2004      SURGEON:                       Raina Mina, MD      ASSISTANT 1:            PREOPERATIVE DIAGNOSES:      1.    Stiffness right shoulder.      2.    Glenohumeral joint synovitis right shoulder.            POSTOPERATIVE DIAGNOSES:      1.    Stiffness right shoulder.      2.    Glenohumeral joint synovitis right shoulder.            PROCEDURE:      1.    Right shoulder arthroscopic debridement and glenohumeral joint      synovitis.      2.    Manipulation of right shoulder.            ASSISTANT:  Lenise Arena, S.A.            ANESTHESIA:  General.            FLUID:  Crystalloid.            DRAINS:  None.            ESTIMATED BLOOD LOSS:  Minimal.            DESCRIPTION OF PROCEDURE:  The patient was brought into a sitting position,      and the shoulder was examined.  The patient had significant stiffness.      Once abduction was performed, there was some audible crepitance. There was      also internal and external rotation easily after the abduction was      released.  The right shoulder was then prepped and draped in the      appropriate manner.  The scope was then inserted into the subacromial      space. There was synovitis of the subacromial space but no evidence of      impingement.  I believe there were probably subacromial adhesions also from      the capsulitis.  A soft tissue bursectomy was performed. No surgery was      done on the acromion.  The arthroscope was then inserted into the      glenohumeral joint. All tendons and ligaments were intact.  There was      obvious capsulitis of the undersurface of the rotator cuff, the rotator      cuff interval, and root of the biceps.  An anterior portal was established.      Utilizing a shaver and Arthrocare wand, all synovitis was debrided. After      this  was done, 40 of Depo with 10 cc of Lidocaine was infiltrated.  The      wounds were closed with nylon. A soft dressing was applied.  The patient  was transferred to the recovery room in stable condition.                        ___________________________________     Date Signed: _______________      Raina Mina, MD                  JLL:amw:SC      D:    10/29/2004      T:    10/30/2004      #:    [id number]      N:    [id number]            cc:   Raina Mina, MD

## 2014-08-16 ENCOUNTER — Emergency Department: Payer: BLUE CROSS/BLUE SHIELD

## 2014-08-16 ENCOUNTER — Emergency Department
Admission: EM | Admit: 2014-08-16 | Discharge: 2014-08-16 | Disposition: A | Discharge status: Home / Self Care | Payer: BLUE CROSS/BLUE SHIELD | Attending: Emergency Medicine | Admitting: Emergency Medicine

## 2014-08-16 DIAGNOSIS — S0990XA Unspecified injury of head, initial encounter: Secondary | ICD-10-CM | POA: Insufficient documentation

## 2014-08-16 DIAGNOSIS — F0781 Postconcussional syndrome: Secondary | ICD-10-CM | POA: Insufficient documentation

## 2014-08-16 DIAGNOSIS — M797 Fibromyalgia: Secondary | ICD-10-CM | POA: Insufficient documentation

## 2014-08-16 DIAGNOSIS — E780 Pure hypercholesterolemia: Secondary | ICD-10-CM | POA: Insufficient documentation

## 2014-08-16 DIAGNOSIS — I2510 Atherosclerotic heart disease of native coronary artery without angina pectoris: Secondary | ICD-10-CM | POA: Insufficient documentation

## 2014-08-16 MED ORDER — MECLIZINE HCL 12.5 MG PO TABS
25.0000 mg | ORAL_TABLET | Freq: Three times a day (TID) | ORAL | Status: AC | PRN
Start: 2014-08-16 — End: ?

## 2014-08-16 NOTE — Discharge Instructions (Signed)
Follow up with your Neurologist.     Post-Concussive Syndrome    You have been diagnosed with post-concussive syndrome.    Post-concussive syndrome can be caused by a significant head injury. People are often knocked out as a result of the head injury. After the head injury, the symptoms that continue to happen are called post-concussive syndrome.    Symptoms after a concussion can last from hours to months and even up to a year, depending on how bad the injury was. The symptoms can be worse or last longer if you have had a concussion in the past. These symptoms can happen soon after the concussion. They can also develop slowly over time.     Some of these symptoms can include:   Chronic headaches.   Light sensitivity.   Sleep problems.   Feeling off-balance when standing or moving.   Nausea (sick to your stomach) and/or vomiting (throwing up).   Feeling that your mind is slow or foggy.   Problems with memory and concentration or attention (easily distracted).   Forgetfulness.    Feeling irritable or depressed.    You can expect these symptoms after a concussion.    You may have had a CT scan or an MRI for your head injury. If it was normal, in most cases you will not need more scans. A person with a normal CT scan can have an injury happen later on. This is a rare event. This may be a blood clot in or around the brain. This is very uncommon in young patients. However, older patients or those who use blood thinners, like warfarin (Coumadin) or enoxaparin (Lovenox), may be at higher risk. Repeat scanning might be needed in these cases. These scans happen if you are having new or unusual symptoms.    At this time, the cause of your symptoms does not seem dangerous. You don t need to stay in the hospital.    If you are an athlete, do not do any athletic activities until your doctor allows you to do so. Returning to sports too soon may increase your time of recovery. Another concussion so soon after  the first may cause permanent problems or even death.    Some things you can try at home are:   Mental rest: Avoid watching TV, playing video games, using a computer, reading, etc.   Do not participate in athletic activities.   You can use NSAID medications like ibuprofen (Advil or Motrin), naproxen (Aleve, Naprosyn).   Avoid medicines that cause sleepiness or stimulation.    Follow up with your doctor as directed.    Follow the instructions for any medication you get prescribed.    We don't believe your condition is dangerous right now. However, you need to be careful. Sometimes a problem that seems small can get serious later. Therefore, it is very important for you to come back here or go to the nearest Emergency Department if you don t get better or your symptoms get worse.    YOU SHOULD SEEK MEDICAL ATTENTION IMMEDIATELY, EITHER HERE OR AT THE NEAREST EMERGENCY DEPARTMENT, IF ANY OF THE FOLLOWING OCCUR:     You have a severe headache or a headache that gets worse and worse.   You have changes in your vision.   You fall often or pass out.   You have slurred speech or drooping of one side of your face.   You have weakness, numbness or tingling in either side of your body.  You become disoriented.   You have a seizure.   You are throwing up constantly and can t keep fluids down.    You have any other symptoms or concerns, or don t get better as expected.    If you can t follow up with your doctor, or if at any time you feel you need to be rechecked or seen again, come back here or go to the nearest emergency department.

## 2014-08-16 NOTE — ED Provider Notes (Signed)
I have briefly evaluated this patient as triage physician in order to facilitate and initiate the ordering of laboratory and imaging studies as needed.      Olevia Bowens, MD  08/16/14 [phone]

## 2014-08-16 NOTE — ED Notes (Signed)
Pt states that she struck the back of her head on the car yesterday, last night had nausea. No has increase in nausea and dizziness.

## 2014-08-16 NOTE — ED Provider Notes (Signed)
[name] EMERGENCY DEPARTMENT H&P         CLINICAL SUMMARY          Diagnosis:    .     Final diagnoses:   Minor head injury, initial encounter   Post concussion syndrome         MDM Notes:      Please see MDM at bottom of note      Disposition:          ED Disposition     Discharge [name] discharge to home/self care.    Condition at disposition: Stable                      CLINICAL INFORMATION        HPI:      Chief Complaint: Head Injury  .    [name] is a 64 y.o. female with h/o CAD, Fibromyalgia, migraines, concussions, HLD, who presents with dizziness onset today. Pt states yesterday she hit her head on the top of her car door frame. No LOC. She now is complaining of int dizziness and shooting pain down from her neck. Pt has hx of concussions and vertigo. No vomiting, fever, diarrhea, or other injury. No other concerns.     History obtained from: Patient      ROS:      Positive and negative ROS elements as per HPI.      Physical Exam:      Pulse 77  BP 119/72 mmHg  Resp 16  SpO2 98 %  Temp 97.4 F (36.3 C)    PHYSICAL EXAM   CONSTITUTIONAL: Mild Distress, Patient is afebrile, Vital signs reviewed, Patient has normal pulse, Patient has normal blood pressure, Patient has normal respiratory rate, Well appearing, Patient appears comfortable, Alert and oriented X 3.   HEAD: Atraumatic, Normocephalic.   EYES: Eyes are normal to inspection, Pupils equal, round and reactive to light, No discharge from eyes, Extraocular muscles intact, Sclera are normal, Conjunctiva are normal.   ENT: Mouth normal to inspection.   NECK: Normal ROM, Cervical spine nontender.   BACK: There is no CVA Tenderness, There is no tenderness to palpation, Normal inspection.   UPPER EXTREMITY: Inspection normal, No cyanosis, No clubbing, No edema, Normal range of motion.   LOWER EXTREMITY: Inspection normal, No cyanosis, No clubbing, No edema, Normal range of motion, No calf tenderness.    NEURO: GCS is 15, No focal motor deficits, Speech normal.   SKIN: Skin is warm, Skin is dry, Skin is normal color.    PSYCHIATRIC: Oriented X 3, Normal affect.             PAST HISTORY        Primary Care Provider: Marvene Staff, MD        PMH/PSH:    .     Past Medical History   Diagnosis Date   . Coronary artery disease    . Fibromyalgia    . Migraines    . High cholesterol    . Concussion        She has no past surgical history on file.      Social/Family History:      She has no tobacco, alcohol, and drug history on file.    History reviewed. No pertinent family history.      Listed Medications on Arrival:    .     Previous Medications    ASPIRIN PO  Take by mouth.    EZETIMIBE (ZETIA) 10 MG TABLET    Take 10 mg by mouth daily.    METOPROLOL TARTRATE PO    Take by mouth.    PRAVASTATIN SODIUM PO    Take by mouth.    TOPIRAMATE (TOPAMAX PO)    Take by mouth.      Allergies: She is allergic to codeine; cortisporin; doxycycline; penicillins; and plavix.            VISIT INFORMATION        Clinical Course in the ED:            Medications Given in the ED:    .     ED Medication Orders     None            Procedures:      Procedures      Interpretations:      O2 sat-           saturation: 98 %; Oxygen use: room air; Interpretation: Normal         Differential Diagnosis (not completely inclusive): head injury            RESULTS        Lab Results:      Results     ** No results found for the last 24 hours. **              Radiology Results:      CT Head WO Contrast   Final Result    No acute process, no change. No bleed or fracture.      Sara Drummer, MD    08/16/2014 7:28 PM                     Scribe Attestation:      I was acting as a Neurosurgeon for Sara Blazing, MD on [name]  Treatment Team: Scribe: [name]     I am the first provider for this patient and I personally performed the services documented. Treatment Team: Scribe: [name], Edson Snowball is scribing for me on [name],[name]. This  note accurately reflects work and decisions made by me.  Sara Blazing, MD           Sara Blazing, MD  08/18/14 [phone]

## 2015-06-24 ENCOUNTER — Emergency Department: Payer: Federal, State, Local not specified - PPO

## 2015-06-24 ENCOUNTER — Emergency Department
Admission: EM | Admit: 2015-06-24 | Discharge: 2015-06-24 | Disposition: A | Discharge status: Home / Self Care | Payer: Federal, State, Local not specified - PPO | Attending: Emergency Medicine | Admitting: Emergency Medicine

## 2015-06-24 ENCOUNTER — Encounter: Payer: Self-pay | Admitting: Emergency Medicine

## 2015-06-24 DIAGNOSIS — Y998 Other external cause status: Secondary | ICD-10-CM | POA: Insufficient documentation

## 2015-06-24 DIAGNOSIS — Y9389 Activity, other specified: Secondary | ICD-10-CM | POA: Diagnosis not present

## 2015-06-24 DIAGNOSIS — W19XXXA Unspecified fall, initial encounter: Secondary | ICD-10-CM

## 2015-06-24 DIAGNOSIS — I10 Essential (primary) hypertension: Secondary | ICD-10-CM | POA: Insufficient documentation

## 2015-06-24 DIAGNOSIS — S63502A Unspecified sprain of left wrist, initial encounter: Secondary | ICD-10-CM | POA: Diagnosis not present

## 2015-06-24 DIAGNOSIS — Y9259 Other trade areas as the place of occurrence of the external cause: Secondary | ICD-10-CM | POA: Insufficient documentation

## 2015-06-24 DIAGNOSIS — S29001A Unspecified injury of muscle and tendon of front wall of thorax, initial encounter: Secondary | ICD-10-CM | POA: Diagnosis present

## 2015-06-24 DIAGNOSIS — S20212A Contusion of left front wall of thorax, initial encounter: Secondary | ICD-10-CM | POA: Insufficient documentation

## 2015-06-24 DIAGNOSIS — W06XXXA Fall from bed, initial encounter: Secondary | ICD-10-CM | POA: Insufficient documentation

## 2015-06-24 DIAGNOSIS — S4992XA Unspecified injury of left shoulder and upper arm, initial encounter: Secondary | ICD-10-CM | POA: Diagnosis not present

## 2015-06-24 DIAGNOSIS — Z880 Allergy status to penicillin: Secondary | ICD-10-CM | POA: Insufficient documentation

## 2015-06-24 HISTORY — DX: Pure hypercholesterolemia, unspecified: E78.00

## 2015-06-24 HISTORY — DX: Fibromyalgia: M79.7

## 2015-06-24 HISTORY — DX: Migraine, unspecified, not intractable, without status migrainosus: G43.909

## 2015-06-24 HISTORY — DX: Essential (primary) hypertension: I10

## 2015-06-24 MED ORDER — KETOROLAC TROMETHAMINE 10 MG PO TABS: 10.0000 mg | ORAL_TABLET | Freq: Three times a day (TID) | ORAL | Status: AC

## 2015-06-24 MED ORDER — KETOROLAC TROMETHAMINE 60 MG/2ML IM SOLN
60.0000 mg | Freq: Once | INTRAMUSCULAR | Status: AC
  Administered 2015-06-24: 60 mg via INTRAMUSCULAR

## 2015-06-24 MED ORDER — KETOROLAC TROMETHAMINE 60 MG/2ML IM SOLN
INTRAMUSCULAR | Status: AC
  Administered 2015-06-24: 60 mg via INTRAMUSCULAR
  Filled 2015-06-24: qty 2

## 2015-06-24 NOTE — ED Notes (Signed)
Pt reports falling out of bed on Friday morning, Pt reports continued pain to left side rib area, reports pain with sitting and taking deep breaths. Pt reports pain to left axilla and left wrist. Also reports pain to left shoulder. Pt denies hitting head or losing consciousness.

## 2015-06-24 NOTE — ED Notes (Signed)
Signature pad not working at this time 

## 2015-06-24 NOTE — Discharge Instructions (Signed)
Chest Contusion A contusion is a deep bruise. Bruises happen when an injury causes bleeding under the skin. Signs of bruising include pain, puffiness (swelling), and discolored skin. The bruise may turn blue, purple, or yellow.  HOME CARE  Put ice on the injured area.  Put ice in a plastic bag.  Place a towel between the skin and the bag.  Leave the ice on for 15-20 minutes at a time, 03-04 times a day for the first 48 hours.  Only take medicine as told by your doctor.  Rest.  Take deep breaths (deep-breathing exercises) as told by your doctor.  Stop smoking if you smoke.  Do not lift objects over 5 pounds (2.3 kilograms) for 3 days or longer if told by your doctor. GET HELP RIGHT AWAY IF:   You have more bruising or puffiness.  You have pain that gets worse.  You have trouble breathing.  You are dizzy, weak, or pass out (faint).  You have blood in your pee (urine) or poop (stool).  You cough up or throw up (vomit) blood.  Your puffiness or pain is not helped with medicines. MAKE SURE YOU:   Understand these instructions.  Will watch your condition.  Will get help right away if you are not doing well or get worse.   This information is not intended to replace advice given to you by your health care provider. Make sure you discuss any questions you have with your health care provider.   Document Released: 11/04/2007 Document Revised: 02/10/2012 Document Reviewed: 11/09/2011 Elsevier Interactive Patient Education 2016 [employer]  Rib Contusion A rib contusion is a deep bruise on your rib area. Contusions are the result of a blunt trauma that causes bleeding and injury to the tissues under the skin. A rib contusion may involve bruising of the ribs and of the skin and muscles in the area. The skin overlying the contusion may turn blue, purple, or yellow. Minor injuries will give you a painless contusion, but more severe contusions may stay painful and swollen for a  few weeks. CAUSES  A contusion is usually caused by a blow, trauma, or direct force to an area of the body. This often occurs while playing contact sports. SYMPTOMS  Swelling and redness of the injured area.  Discoloration of the injured area.  Tenderness and soreness of the injured area.  Pain with or without movement. DIAGNOSIS  The diagnosis can be made by taking a medical history and performing a physical exam. An X-ray, CT scan, or MRI may be needed to determine if there were any associated injuries, such as broken bones (fractures) or internal injuries. TREATMENT  Often, the best treatment for a rib contusion is rest. Icing or applying cold compresses to the injured area may help reduce swelling and inflammation. Deep breathing exercises may be recommended to reduce the risk of partial lung collapse and pneumonia. Over-the-counter or prescription medicines may also be recommended for pain control. HOME CARE INSTRUCTIONS   Apply ice to the injured area:  Put ice in a plastic bag.  Place a towel between your skin and the bag.  Leave the ice on for 20 minutes, 2-3 times per day.  Take medicines only as directed by your health care provider.  Rest the injured area. Avoid strenuous activity and any activities or movements that cause pain. Be careful during activities and avoid bumping the injured area.  Perform deep-breathing exercises as directed by your health care provider.  Do not lift  anything that is heavier than 5 lb (2.3 kg) until your health care provider approves.  Do not use any tobacco products, including cigarettes, chewing tobacco, or electronic cigarettes. If you need help quitting, ask your health care provider. SEEK MEDICAL CARE IF:   You have increased bruising or swelling.  You have pain that is not controlled with treatment.  You have a fever. SEEK IMMEDIATE MEDICAL CARE IF:   You have difficulty breathing or shortness of breath.  You develop a  continual cough, or you cough up thick or bloody sputum.  You feel sick to your stomach (nauseous), you throw up (vomit), or you have abdominal pain.   This information is not intended to replace advice given to you by your health care provider. Make sure you discuss any questions you have with your health care provider.   Document Released: 02/10/2001 Document Revised: 06/08/2014 Document Reviewed: 02/27/2014 Elsevier Interactive Patient Education 2016 Elsevier Inc.  Wrist Sprain A wrist sprain is a stretch or tear in the strong, fibrous tissues (ligaments) that connect your wrist bones. The ligaments of your wrist may be easily sprained. There are three types of wrist sprains.  Grade 1. The ligament is not stretched or torn, but the sprain causes pain.  Grade 2. The ligament is stretched or partially torn. You may be able to move your wrist, but not very much.  Grade 3. The ligament or muscle completely tears. You may find it difficult or extremely painful to move your wrist even a little. CAUSES Often, wrist sprains are a result of a fall or an injury. The force of the impact causes the fibers of your ligament to stretch too much or tear. Common causes of wrist sprains include:  Overextending your wrist while catching a ball with your hands.  Repetitive or strenuous extension or bending of your wrist.  Landing on your hand during a fall. RISK FACTORS  Having previous wrist injuries.  Playing contact sports, such as boxing or wrestling.  Participating in activities in which falling is common.  Having poor wrist strength and flexibility. SIGNS AND SYMPTOMS  Wrist pain.  Wrist tenderness.  Inflammation or bruising of the wrist area.  Hearing a "pop" or feeling a tear at the time of the injury.  Decreased wrist movement due to pain, stiffness, or weakness. DIAGNOSIS Your health care provider will examine your wrist. In some cases, an X-ray will be taken to make sure you  did not break any bones. If your health care provider thinks that you tore a ligament, he or she may order an MRI of your wrist. TREATMENT Treatment involves resting and icing your wrist. You may also need to take pain medicines to help lessen pain and inflammation. Your health care provider may recommend keeping your wrist still (immobilized) with a splint to help your sprain heal. When the splint is no longer necessary, you may need to perform strengthening and stretching exercises. These exercises help you to regain strength and full range of motion in your wrist. Surgery is not usually needed for wrist sprains unless the ligament completely tears. HOME CARE INSTRUCTIONS  Rest your wrist. Do not do things that cause pain.  Wear your wrist splint as directed by your health care provider.  Take medicines only as directed by your health care provider.  To ease pain and swelling, apply ice to the injured area.  Put ice in a plastic bag.  Place a towel between your skin and the bag.  Leave  the ice on for 20 minutes, 2-3 times a day. SEEK MEDICAL CARE IF:  Your pain, discomfort, or swelling gets worse even with treatment.  You feel sudden numbness in your hand.   This information is not intended to replace advice given to you by your health care provider. Make sure you discuss any questions you have with your health care provider.   Document Released: 01/19/2014 Document Reviewed: 01/19/2014 Elsevier Interactive Patient Education [employer]  Your exam and x-rays are normal today. Continue your home medicines as prescribed. Follow-up with [facility] while in town. See your provider when you return home. Take the Toradol as directed for pain relief.

## 2015-06-24 NOTE — ED Notes (Signed)
Provider at bedside

## 2015-06-25 NOTE — ED Provider Notes (Signed)
[facility] Emergency Department Provider Note ____________________________________________  Time seen: 1735  I have reviewed the triage vital signs and the nursing notes.  HISTORY  Chief Complaint  Fall  HPI [name] is a 65 y.o. female presents to the ED for evaluation of injuries that she describes were obtained after she fell out of the bed at her hotel room. She describes she is visiting from [place] as her mother-in-law is in [facility] for treatment. She describes sitting up in the bed and dosing off, and then to be following onto the left side of her ribs and chest. This occurred on Friday morning about 1 AM, 3 days prior to arrival. She denies any head injury, loss of consciousness, laceration, abrasions, or bruising. Since that time she's noted increased pain with walking and deep breaths. She also notes some intermittent shortness of breath with walking distances. She describes pain despite use of her daily medications which include Percocet, Vicodin, Flexeril, and morphine.   Past Medical History  Diagnosis Date  . High cholesterol   . Fibromyalgia   . Hypertension   . Migraines     There are no active problems to display for this patient.   Past Surgical History  Procedure Laterality Date  . Coronary angioplasty with stent placement      Current Outpatient Rx  Name  Route  Sig  Dispense  Refill  . ketorolac (TORADOL) 10 MG tablet   Oral   Take 1 tablet (10 mg total) by mouth every 8 (eight) hours.   15 tablet   0    Allergies Codeine; Cortisporin; Doxycycline; Penicillins; Plavix; and Sulfa antibiotics  No family history on file.  Social History Social History  Substance Use Topics  . Smoking status: Never Smoker   . Smokeless tobacco: None  . Alcohol Use: No   Review of Systems  Constitutional: Negative for fever. Eyes: Negative for visual changes. ENT: Negative for sore throat. Cardiovascular: Negative for  chest pain. Respiratory: Negative for shortness of breath. Gastrointestinal: Negative for abdominal pain, vomiting and diarrhea. Genitourinary: Negative for dysuria. Musculoskeletal: Negative for back pain. Left shoulder, wrist, and chest wall pain.  Skin: Negative for rash. Neurological: Negative for headaches, focal weakness or numbness. ____________________________________________  PHYSICAL EXAM:  VITAL SIGNS: ED Triage Vitals  Enc Vitals Group     BP 06/24/15 1606 128/76 mmHg     Pulse Rate 06/24/15 1606 66     Resp 06/24/15 1606 18     Temp 06/24/15 1606 98 F (36.7 C)     Temp Source 06/24/15 1606 Oral     SpO2 06/24/15 1606 98 %     Weight 06/24/15 1606 248 lb (112.492 kg)     Height 06/24/15 1606  (1.778 m)     Head Cir --      Peak Flow --      Pain Score 06/24/15 1609 9     Pain Loc --      Pain Edu? --      Excl. in GC? --    Constitutional: Alert and oriented. Well appearing and in no distress. Head: Normocephalic and atraumatic.      Eyes: Conjunctivae are normal. PERRL. Normal extraocular movements      Ears: Canals clear. TMs intact bilaterally.   Nose: No congestion/rhinorrhea.   Mouth/Throat: Mucous membranes are moist.   Neck: Supple. No thyromegaly. Hematological/Lymphatic/Immunological: No cervical lymphadenopathy. Cardiovascular: Normal rate, regular rhythm.  Respiratory: Normal respiratory effort. No  wheezes/rales/rhonchi. Gastrointestinal: Soft and nontender. No distention. Musculoskeletal: Normal spinal alignment without midline tenderness, spasm, deformity, or step-off. Patient with full active range of motion of the lumbar spine and transitions mostly from supine to sit. She is noted to have normal wrist exam without deformity. Normal left shoulder exam without deficit.  Nontender with normal range of motion in all extremities.  Neurologic:  Normal gait without ataxia. Normal speech and language. No gross focal neurologic deficits  are appreciated. Skin:  Skin is warm, dry and intact. No rash noted. Psychiatric: Mood and affect are normal. Patient exhibits appropriate insight and judgment. ____________________________________________   RADIOLOGY  Left Shoulder IMPRESSION: No acute bony abnormality with nonvisualization of the scapula tip.  Left Wrist IMPRESSION: Negative.  Chest 2V IMPRESSION: 1. Subsegmental atelectasis or scarring in the left lower lobe. 2. Mild elevation of left hemidiaphragm. 3. 19 x 11 mm nodular density projecting over the lower lobes or lower mediastinum seen only on the lateral projection. This may represent a lymph node, or could represent a pulmonary nodule. Further evaluation with nonemergent chest CT with contrast is suggested in the near future. ____________________________________________  PROCEDURES  Toradol 60 mg IM ____________________________________________  INITIAL IMPRESSION / ASSESSMENT AND PLAN / ED COURSE  Patient with a normal exam without evidence of fracture or dislocation. Chest x-ray results relayed to the patient. She will follow-up with your provider in [place]. CD-ROM copy of chest films provided. Patient is discharged with prescriptions for Toradol to dose with her daily narcotic pain medicines. She will return to the ED as needed for worsening symptoms.  ____________________________________________  FINAL CLINICAL IMPRESSION(S) / ED DIAGNOSES  Final diagnoses:  Fall, initial encounter  Chest wall contusion, left, initial encounter  Wrist sprain, left, initial encounter     Lissa Hoard, PA-C 06/26/15 0021  Minna Antis, MD 06/28/15 [phone]

## 2015-07-04 ENCOUNTER — Emergency Department
Admission: EM | Admit: 2015-07-04 | Discharge: 2015-07-04 | Disposition: A | Discharge status: Home / Self Care | Payer: Federal, State, Local not specified - PPO | Attending: Emergency Medicine | Admitting: Emergency Medicine

## 2015-07-04 ENCOUNTER — Encounter: Payer: Self-pay | Admitting: Emergency Medicine

## 2015-07-04 ENCOUNTER — Emergency Department: Payer: Federal, State, Local not specified - PPO

## 2015-07-04 DIAGNOSIS — I10 Essential (primary) hypertension: Secondary | ICD-10-CM | POA: Diagnosis not present

## 2015-07-04 DIAGNOSIS — Z791 Long term (current) use of non-steroidal anti-inflammatories (NSAID): Secondary | ICD-10-CM | POA: Insufficient documentation

## 2015-07-04 DIAGNOSIS — W06XXXS Fall from bed, sequela: Secondary | ICD-10-CM | POA: Insufficient documentation

## 2015-07-04 DIAGNOSIS — Z79899 Other long term (current) drug therapy: Secondary | ICD-10-CM | POA: Diagnosis not present

## 2015-07-04 DIAGNOSIS — Z880 Allergy status to penicillin: Secondary | ICD-10-CM | POA: Insufficient documentation

## 2015-07-04 DIAGNOSIS — S20212S Contusion of left front wall of thorax, sequela: Secondary | ICD-10-CM | POA: Insufficient documentation

## 2015-07-04 DIAGNOSIS — R079 Chest pain, unspecified: Secondary | ICD-10-CM | POA: Diagnosis present

## 2015-07-04 DIAGNOSIS — E785 Hyperlipidemia, unspecified: Secondary | ICD-10-CM | POA: Insufficient documentation

## 2015-07-04 LAB — CBC WITH DIFFERENTIAL/PLATELET
Basophils Absolute: 0 10*3/uL (ref 0–0.1)
Basophils Relative: 1 %
Eosinophils Absolute: 0.4 10*3/uL (ref 0–0.7)
Eosinophils Relative: 5 %
HEMATOCRIT: 37.3 % (ref 35.0–47.0)
HEMOGLOBIN: 12.3 g/dL (ref 12.0–16.0)
LYMPHS ABS: 1.8 10*3/uL (ref 1.0–3.6)
LYMPHS PCT: 23 %
MCH: 29.8 pg (ref 26.0–34.0)
MCHC: 33 g/dL (ref 32.0–36.0)
MCV: 90.1 fL (ref 80.0–100.0)
MONOS PCT: 7 %
Monocytes Absolute: 0.6 10*3/uL (ref 0.2–0.9)
NEUTROS ABS: 4.9 10*3/uL (ref 1.4–6.5)
NEUTROS PCT: 64 %
Platelets: 203 10*3/uL (ref 150–440)
RBC: 4.13 MIL/uL (ref 3.80–5.20)
RDW: 13.6 % (ref 11.5–14.5)
WBC: 7.7 10*3/uL (ref 3.6–11.0)

## 2015-07-04 LAB — BASIC METABOLIC PANEL
Anion gap: 7 (ref 5–15)
BUN: 12 mg/dL (ref 6–20)
CHLORIDE: 110 mmol/L (ref 101–111)
CO2: 25 mmol/L (ref 22–32)
CREATININE: 0.84 mg/dL (ref 0.44–1.00)
Calcium: 9.4 mg/dL (ref 8.9–10.3)
GFR calc Af Amer: >60 mL/min (ref >60)
GFR calc non Af Amer: >60 mL/min (ref >60)
Glucose, Bld: 106 mg/dL — ABNORMAL HIGH (ref 65–99)
POTASSIUM: 3.7 mmol/L (ref 3.5–5.1)
Sodium: 142 mmol/L (ref 135–145)

## 2015-07-04 MED ORDER — IOHEXOL 300 MG/ML  SOLN
75.0000 mL | Freq: Once | INTRAMUSCULAR | Status: DC | PRN
  Filled 2015-07-04: qty 75

## 2015-07-04 NOTE — ED Notes (Signed)
Says fell last week and is getting better, but has pain under breast on ribs still

## 2015-07-04 NOTE — Discharge Instructions (Signed)
Continue previous medications

## 2015-07-04 NOTE — ED Notes (Signed)
Pain from fall on left side 2 weeks ago, has been xray'd twice since fall, no break noted, however, pt states she can't take a deep breath since fall. Pt has hx of fibromyalgia.

## 2015-07-04 NOTE — ED Provider Notes (Signed)
[facility] Emergency Department Provider Note  ____________________________________________  Time seen: Approximately 11:17 AM  I have reviewed the triage vital signs and the nursing notes.   HISTORY  Chief Complaint Chest Pain    HPI Rhonda Ayala is a 65 y.o. female patient complaining of left side anterior and posterior chest wall pain secondary to a fall 2 weeks ago. Patient was initially seen in ER 3 days after injury with negative x-rays of the chest. Patient follow up with family doctor last week and again negative chest x-rays. Patient continues to have pain which increased with deep inspirations and coughing. She states those 2 provocative measures cause her pain that she rates over 10/10. Patient describes pain as sharp. Patient had adequate pain medication secondary to her diagnosis of fibromyalgia.  Past Medical History  Diagnosis Date  . High cholesterol   . Fibromyalgia   . Hypertension   . Migraines     There are no active problems to display for this patient.   Past Surgical History  Procedure Laterality Date  . Coronary angioplasty with stent placement      Current Outpatient Rx  Name  Route  Sig  Dispense  Refill  . gabapentin (NEURONTIN) 300 MG capsule   Oral   Take 300 mg by mouth 3 (three) times daily.         Marland Kitchen HYDROcodone-acetaminophen (NORCO/VICODIN) 5-325 MG tablet   Oral   Take 1 tablet by mouth every 6 (six) hours as needed for moderate pain.         Marland Kitchen HYDROmorphone (DILAUDID) 2 MG tablet   Oral   Take 2 mg by mouth every 4 (four) hours as needed for severe pain.         Marland Kitchen ketorolac (TORADOL) 10 MG tablet   Oral   Take 1 tablet (10 mg total) by mouth every 8 (eight) hours.   15 tablet   0     Allergies Codeine; Cortisporin; Doxycycline; Penicillins; Plavix; and Sulfa antibiotics  No family history on file.  Social History Social History  Substance Use Topics  . Smoking status: Never Smoker    . Smokeless tobacco: None  . Alcohol Use: No    Review of Systems Constitutional: No fever/chills Eyes: No visual changes. ENT: No sore throat. Cardiovascular: Denies chest pain. Respiratory: Denies shortness of breath. Gastrointestinal: No abdominal pain.  No nausea, no vomiting.  No diarrhea.  No constipation. Genitourinary: Negative for dysuria. Musculoskeletal: The anterior and posterior chest wall pain Skin: Negative for rash. Neurological: Negative for headaches, focal weakness or numbness. Endocrine:Hyperlipidemia and hypertension. Hematological/Lymphatic: Allergic/Immunilogical: Medication list  10-point ROS otherwise negative.  ____________________________________________   PHYSICAL EXAM:  VITAL SIGNS: ED Triage Vitals  Enc Vitals Group     BP 07/04/15 1012 143/79 mmHg     Pulse Rate 07/04/15 1012 75     Resp 07/04/15 1012 18     Temp 07/04/15 1012 98.2 F (36.8 C)     Temp Source 07/04/15 1012 Oral     SpO2 07/04/15 1012 94 %     Weight 07/04/15 1040 248 lb (112.492 kg)     Height 07/04/15 1040  (1.778 m)     Head Cir --      Peak Flow --      Pain Score 07/04/15 1013 10     Pain Loc --      Pain Edu? --      Excl. in GC? --  Constitutional: Alert and oriented. Well appearing and in no acute distress. Eyes: Conjunctivae are normal. PERRL. EOMI. Head: Atraumatic. Nose: No congestion/rhinnorhea. Mouth/Throat: Mucous membranes are moist.  Oropharynx non-erythematous. Neck: No stridor.   Hematological/Lymphatic/Immunilogical: No cervical lymphadenopathy. Cardiovascular: Normal rate, regular rhythm. Grossly normal heart sounds.  Good peripheral circulation. Respiratory: Normal respiratory effort.  No retractions. Lungs CTAB. Gastrointestinal: Soft and nontender. No distention. No abdominal bruits. No CVA tenderness. Musculoskeletal: No chest wall left or  scapular deformity. Patient's has moderate guarding palpation anterior chest wall. Patient  is decreased range of motion of overhead reaching secondary to pain left scapular area.  Neurologic:  Normal speech and language. No gross focal neurologic deficits are appreciated. No gait instability. Skin:  Skin is warm, dry and intact. No rash noted. Psychiatric: Mood and affect are normal. Speech and behavior are normal.  ____________________________________________   LABS (all labs ordered are listed, but only abnormal results are displayed)  Labs Reviewed  BASIC METABOLIC PANEL - Abnormal; Notable for the following:    Glucose, Bld 106 (*)    All other components within normal limits  CBC WITH DIFFERENTIAL/PLATELET   ____________________________________________  EKG   ____________________________________________  RADIOLOGY  CT of the chest was negative for any fractures. Arteriosclerosis, gallbladder sludge, and fatty liver was were apparent. ____________________________________________   PROCEDURES  Procedure(s) performed: None  Critical Care performed: No  ____________________________________________   INITIAL IMPRESSION / ASSESSMENT AND PLAN / ED COURSE  Pertinent labs & imaging results that were available during my care of the patient were reviewed by me and considered in my medical decision making (see chart for details).  Chest wall contusion. Discussed CT findings with patient and advised follow-up with her family doctor and cardiologist for further evaluation. Discharge care instructions were given for chest wall contusion and advised to continue her pain medication as needed. ____________________________________________   FINAL CLINICAL IMPRESSION(S) / ED DIAGNOSES  Final diagnoses:  Contusion, chest wall, left, sequela      Joni Reining, PA-C 07/04/15 1449  Minna Antis, MD 07/04/15 1505

## 2015-07-04 NOTE — ED Notes (Signed)
States she fell from bed   Landing on left side   Having pain under left breast pain and left arm

## 2015-07-04 NOTE — ED Notes (Signed)
Patient transported to CT 

## 2016-04-17 ENCOUNTER — Other Ambulatory Visit: Payer: Self-pay | Admitting: Internal Medicine

## 2016-04-27 ENCOUNTER — Other Ambulatory Visit: Payer: Self-pay | Admitting: Internal Medicine

## 2017-09-20 IMAGING — CR DG WRIST COMPLETE 3+V*L*
1 series · 4 of 4 positions shown · non-contrast
Comparison: None.

CLINICAL DATA: Pt reports falling out of bed on [REDACTED] morning, Pt
reports continued pain to left side rib area, reports pain with
sitting and taking deep breaths. Pt reports pain to left axilla and
left wrist.

EXAM:
LEFT WRIST - COMPLETE 3+ VIEW

[Series 1: x wrist pa left · 0.14mm/px · 4 of 4 slices shown]
[im 1/4]
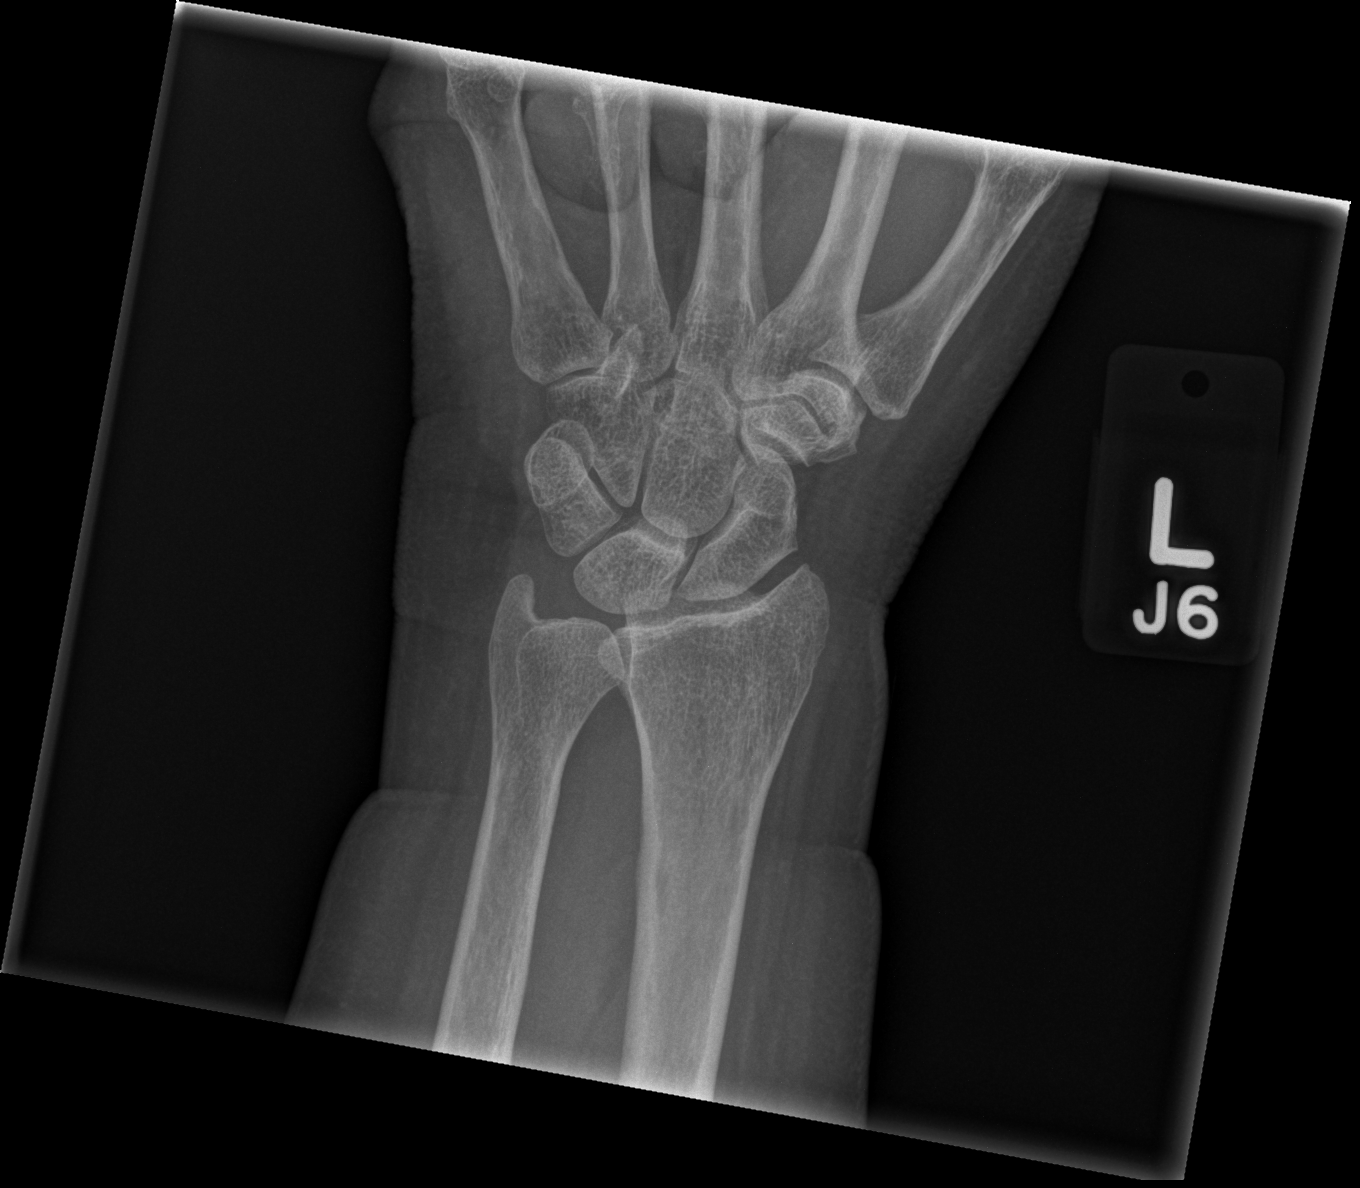
[im 2/4]
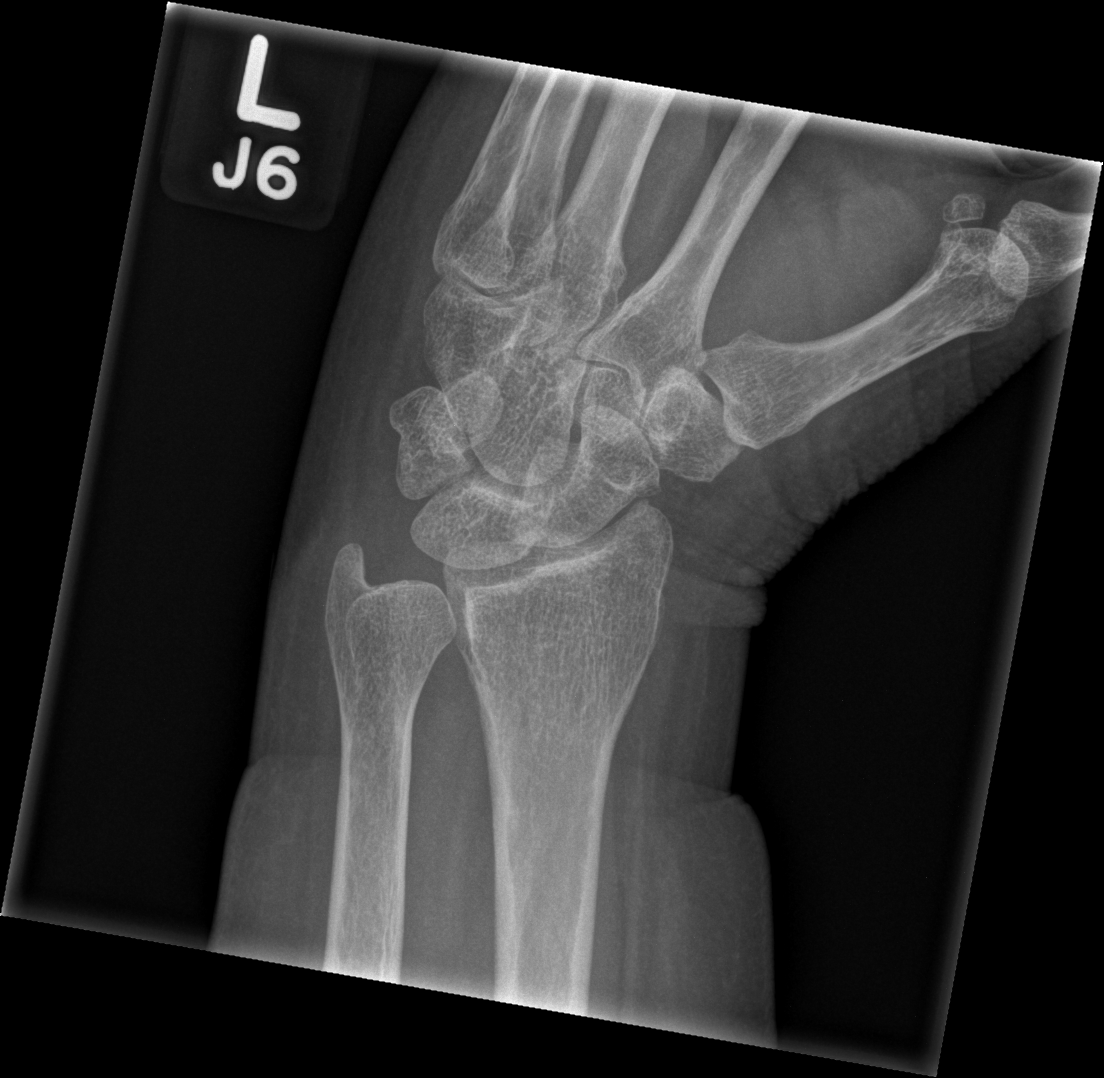
[im 3/4]
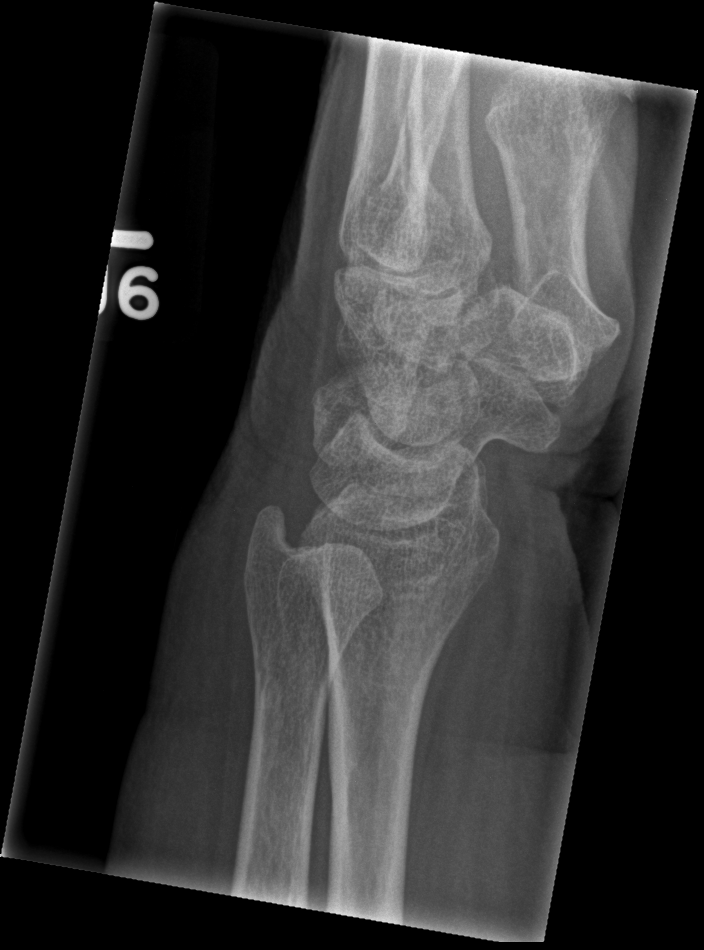
[im 4/4]
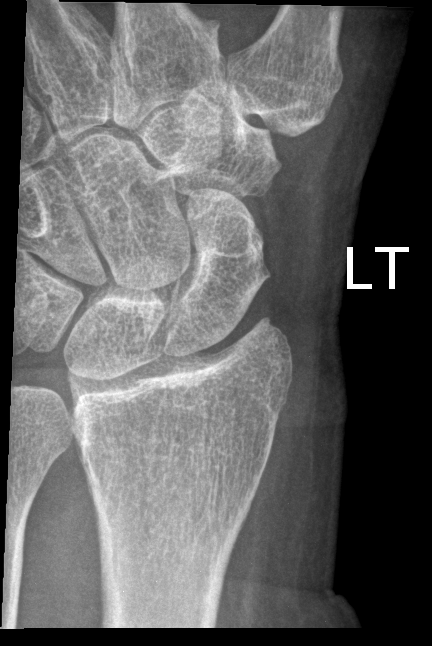

[4 of 4 positions shown; findings below may reference images not displayed]

FINDINGS: There is no evidence of fracture or dislocation. There is no
evidence of arthropathy or other focal bone abnormality. Soft
tissues are unremarkable.
IMPRESSION: Negative.

## 2017-09-30 IMAGING — CT CT CHEST W/ CM
1 series · 14 of 33 positions shown, 18 images · IV contrast (omnipaque)
Comparison: No prior CT.  Chest x-ray 06/24/2015.

CLINICAL DATA: 64-year-old who fell approximately 2 weeks ago and
presented with persistent left anterior chest pain. Abnormal chest
x-ray 1-1/2 weeks ago demonstrating a possible nodule in a lower
lobe or in the mediastinum visualized only on the lateral view.

EXAM:
CT CHEST WITH CONTRAST
TECHNIQUE: Multidetector CT imaging of the chest was performed during
intravenous contrast administration.
CONTRAST:  75 ml Omnipaque 300 IV.

[Series 2: routine chest with · axial · 0.71mm/px · z∈[+186,+410]mm · 14 of 53 slices shown, 18 images]
[im 4/53  mediastinal]
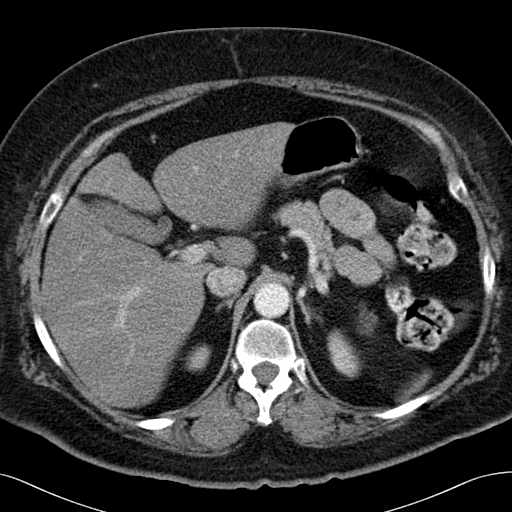
[im 4/53  lung]
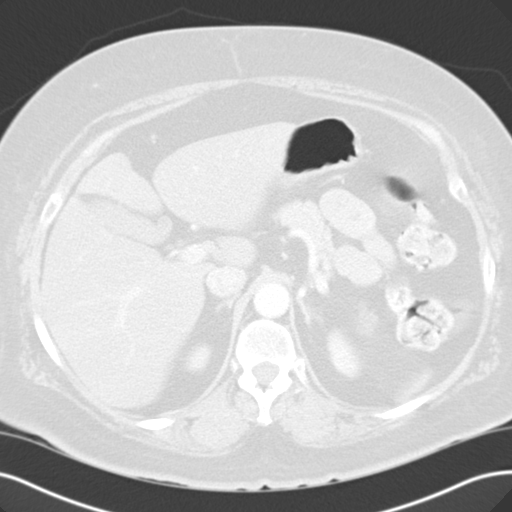
[im 8/53  lung]
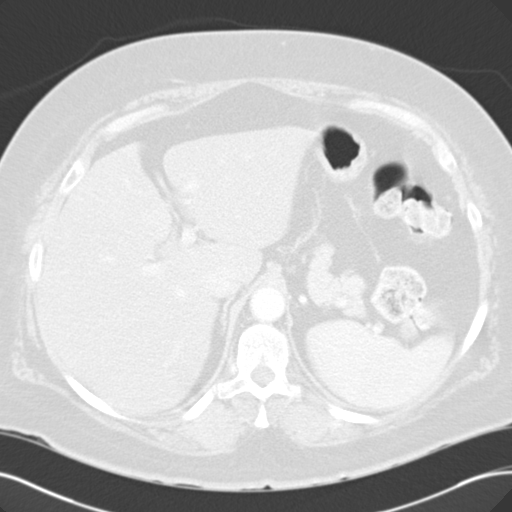
[im 11/53  lung]
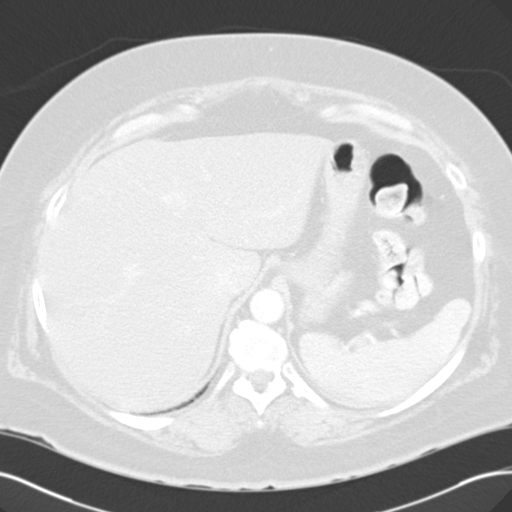
[im 14/53  lung]
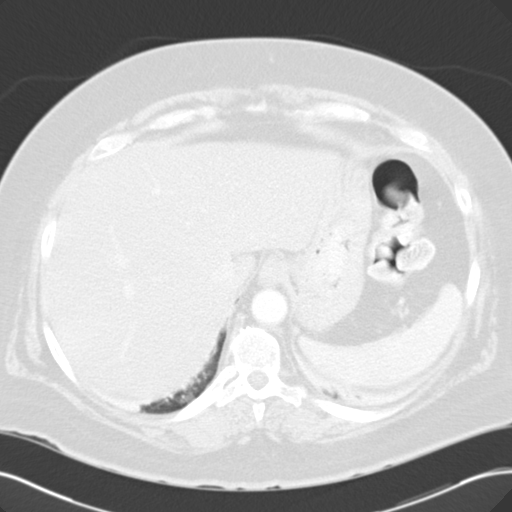
[im 18/53  mediastinal]
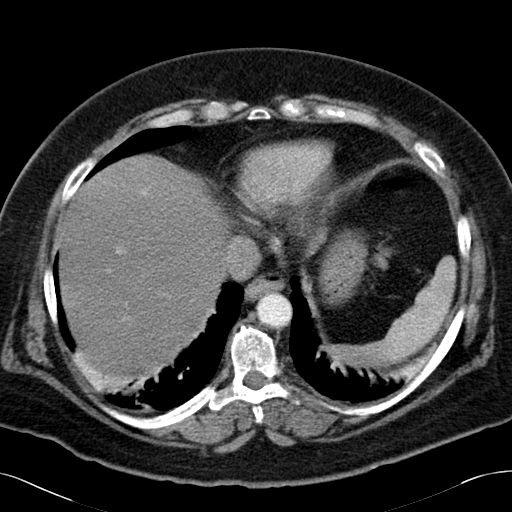
[im 18/53  lung]
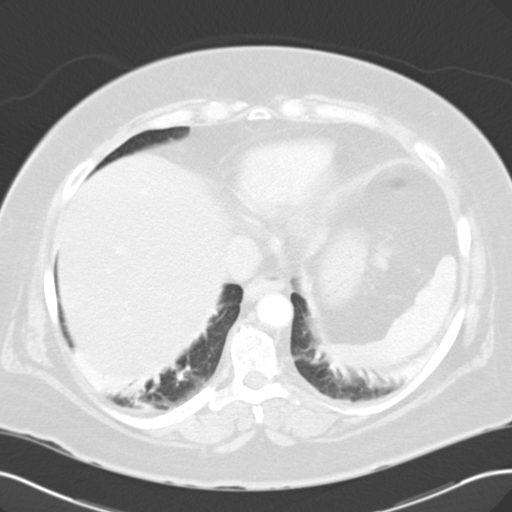
[im 22/53  lung]
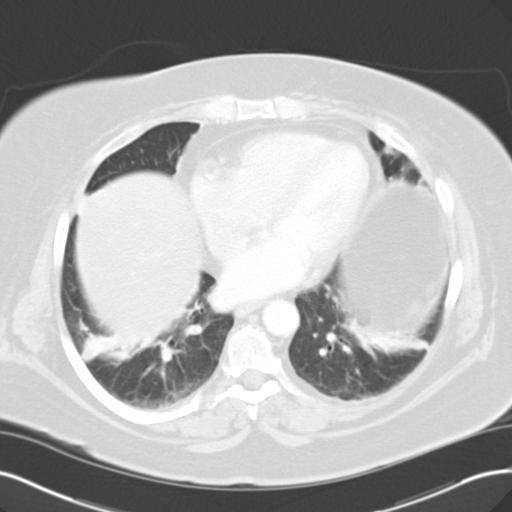
[im 25/53  lung]
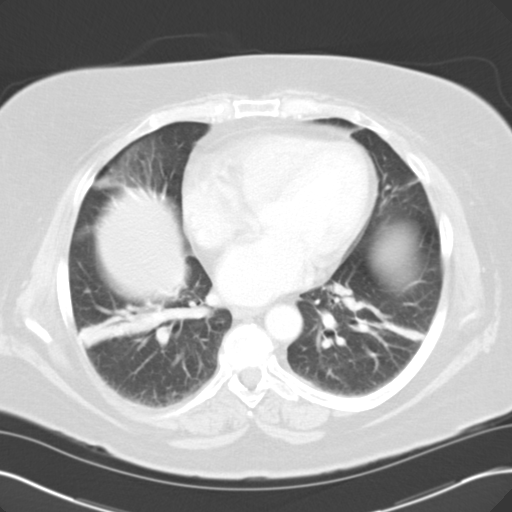
[im 27/53  lung]
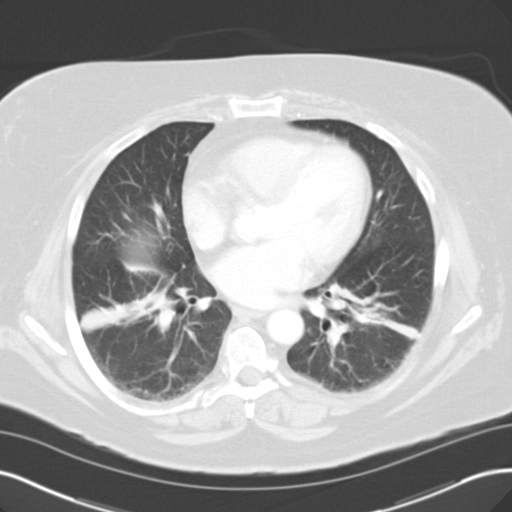
[im 31/53  mediastinal]
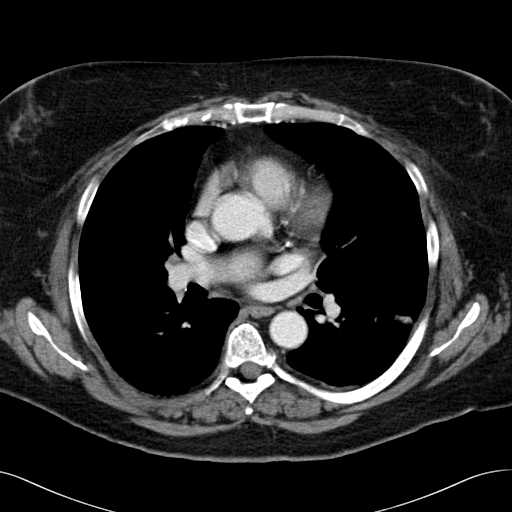
[im 31/53  lung]
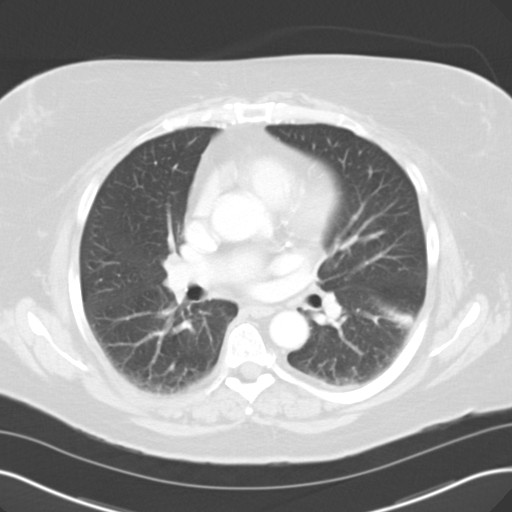
[im 35/53  lung]
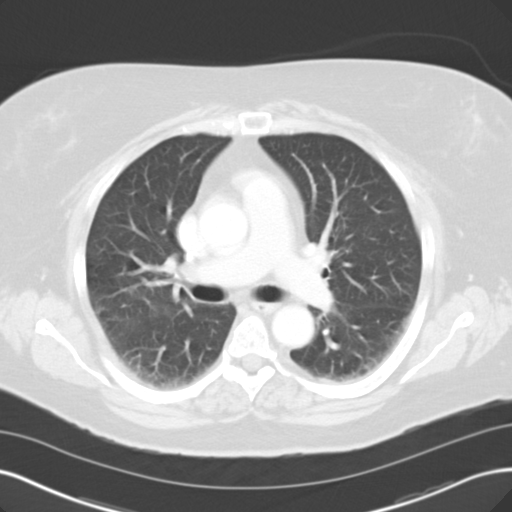
[im 39/53  lung]
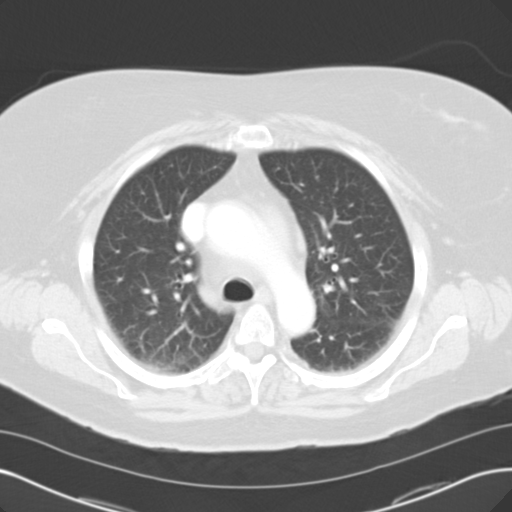
[im 42/53  lung]
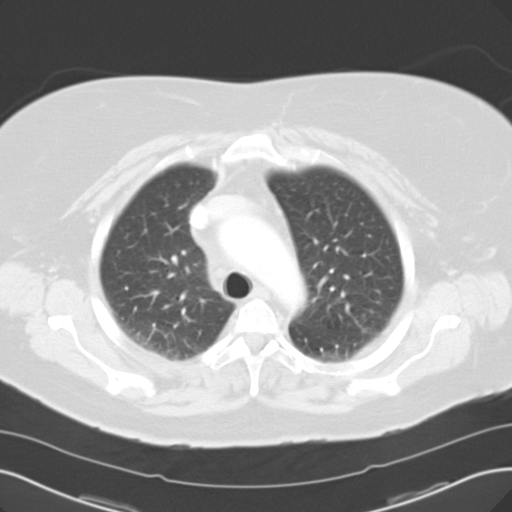
[im 45/53  mediastinal]
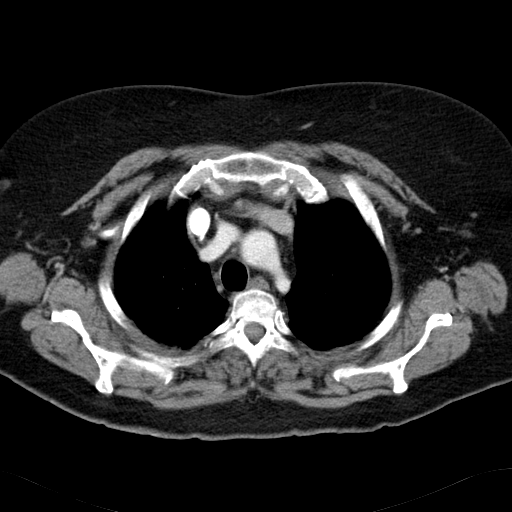
[im 45/53  lung]
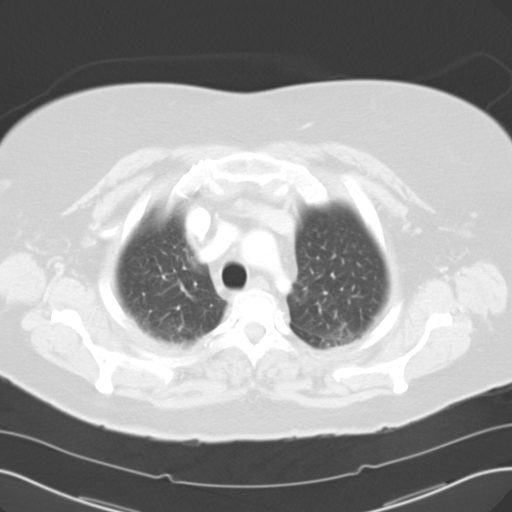
[im 49/53  lung]
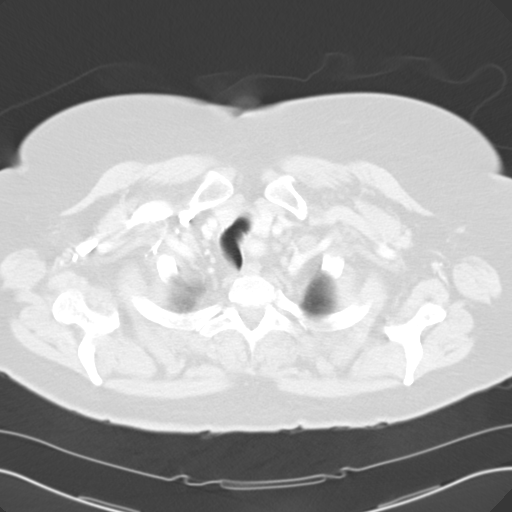

[14 of 33 positions shown; findings below may reference images not displayed]

FINDINGS: Cardiovascular: Heart mildly enlarged. Severe LAD and mild left
circumflex coronary atherosclerosis. Aortic annular calcification.
Mild atherosclerosis involving the thoracic and upper abdominal
aorta without evidence of aneurysm or dissection. Bovine aortic arch
anatomy (left common carotid artery arises from the innominate
artery). Proximal great vessels widely patent.

Mediastinum/Lymph Nodes: No pathologically enlarged mediastinal,
hilar or axillary lymph nodes. No mediastinal masses.
Normal-appearing esophagus. Visualized thyroid gland unremarkable.

Lungs/Pleura: Symmetric linear atelectasis involving the lower
lobes. Mild linear atelectasis or scarring in the right middle lobe
and lingula. No confluent airspace consolidation. Mild
bronchiectasis involving the lower lobes, right greater than left.
No pulmonary parenchymal nodules or masses. No evidence of
interstitial lung disease. No pleural effusions. Central airways
patent without significant bronchial wall thickening.

Upper abdomen: Mild eventration of both anterior hemidiaphragm.
Diffuse steatosis throughout the visualized liver without focal
hepatic parenchymal abnormality. High attenuation bile within the
visualized gallbladder without calcified gallstones.

Musculoskeletal: Degenerative disc disease and spondylosis
throughout the thoracic spine. No acute or subacute osseous
abnormality.
IMPRESSION: 1. Linear atelectasis involving the lower lobes. Minimal linear
atelectasis or scarring in the right middle lobe and lingula. No
acute cardiopulmonary disease otherwise.
2. No evidence of lung nodule as questioned on prior chest x-ray.
3. Mild cardiomegaly. Severe LAD and mild left circumflex coronary
artery atherosclerosis.
4. Diffuse steatosis throughout the visualized liver.
5. Query gallbladder sludge. No calcified gallstones within the
visualized gallbladder. No evidence of cholecystitis.

## 2018-04-20 ENCOUNTER — Encounter (INDEPENDENT_AMBULATORY_CARE_PROVIDER_SITE_OTHER): Payer: Self-pay

## 2018-05-18 ENCOUNTER — Encounter (INDEPENDENT_AMBULATORY_CARE_PROVIDER_SITE_OTHER): Payer: Self-pay

## 2018-06-08 ENCOUNTER — Encounter (INDEPENDENT_AMBULATORY_CARE_PROVIDER_SITE_OTHER): Payer: Self-pay
